# Patient Record
Sex: Female | Born: 1985 | Race: Asian | Hispanic: No | Marital: Single | Smoking: Never smoker
Health system: Southern US, Community
[De-identification: ages and names within clinical notes are randomized; demographics above are authoritative.]

## PROBLEM LIST (undated history)

## (undated) DIAGNOSIS — E039 Hypothyroidism, unspecified: Secondary | ICD-10-CM

## (undated) HISTORY — PX: THYMECTOMY: SHX1063

## (undated) HISTORY — PX: THYROIDECTOMY: SHX17

## (undated) HISTORY — PX: CARDIAC TUMOR EXCISION: SHX1297

---
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
## 2006-02-04 ENCOUNTER — Ambulatory Visit: Payer: Self-pay | Admitting: Endocrinology

## 2006-02-25 ENCOUNTER — Ambulatory Visit: Payer: Self-pay | Admitting: Endocrinology

## 2006-04-04 ENCOUNTER — Ambulatory Visit: Payer: Self-pay | Admitting: Endocrinology

## 2006-06-09 ENCOUNTER — Ambulatory Visit: Payer: Self-pay | Admitting: Endocrinology

## 2006-08-09 ENCOUNTER — Ambulatory Visit: Payer: Self-pay | Admitting: Endocrinology

## 2007-01-10 ENCOUNTER — Ambulatory Visit: Payer: Self-pay | Admitting: Cardiology

## 2007-01-11 ENCOUNTER — Ambulatory Visit: Payer: Self-pay | Admitting: Critical Care Medicine

## 2007-01-11 ENCOUNTER — Encounter: Payer: Self-pay | Admitting: Pulmonary Disease

## 2007-01-11 ENCOUNTER — Ambulatory Visit: Payer: Self-pay | Admitting: Endocrinology

## 2007-01-11 ENCOUNTER — Inpatient Hospital Stay (HOSPITAL_COMMUNITY): Admission: EM | Admit: 2007-01-11 | Discharge: 2007-01-29 | Payer: Self-pay | Admitting: Emergency Medicine

## 2007-01-13 ENCOUNTER — Ambulatory Visit: Payer: Self-pay | Admitting: Thoracic Surgery

## 2007-01-13 ENCOUNTER — Encounter: Payer: Self-pay | Admitting: Critical Care Medicine

## 2007-01-13 ENCOUNTER — Encounter: Payer: Self-pay | Admitting: Thoracic Surgery

## 2007-01-20 ENCOUNTER — Encounter: Payer: Self-pay | Admitting: Thoracic Surgery

## 2007-02-02 ENCOUNTER — Encounter: Payer: Self-pay | Admitting: Endocrinology

## 2007-02-03 ENCOUNTER — Ambulatory Visit: Payer: Self-pay | Admitting: Internal Medicine

## 2007-02-03 DIAGNOSIS — I10 Essential (primary) hypertension: Secondary | ICD-10-CM | POA: Insufficient documentation

## 2007-02-03 DIAGNOSIS — D649 Anemia, unspecified: Secondary | ICD-10-CM | POA: Insufficient documentation

## 2007-02-03 DIAGNOSIS — E878 Other disorders of electrolyte and fluid balance, not elsewhere classified: Secondary | ICD-10-CM | POA: Insufficient documentation

## 2007-02-06 LAB — CONVERTED CEMR LAB
Basophils Absolute: 0.1 10*3/uL (ref 0.0–0.1)
Basophils Relative: 0.8 % (ref 0.0–1.0)
CO2: 30 meq/L (ref 19–32)
Calcium: 7.9 mg/dL — ABNORMAL LOW (ref 8.4–10.5)
Chloride: 99 meq/L (ref 96–112)
Eosinophils Relative: 3.6 % (ref 0.0–5.0)
Hemoglobin: 11.8 g/dL — ABNORMAL LOW (ref 12.0–15.0)
MCHC: 33.5 g/dL (ref 30.0–36.0)
Neutrophils Relative %: 62.5 % (ref 43.0–77.0)
Potassium: 4.7 meq/L (ref 3.5–5.1)
Sodium: 140 meq/L (ref 135–145)

## 2007-02-20 ENCOUNTER — Ambulatory Visit: Payer: Self-pay | Admitting: Endocrinology

## 2007-02-20 DIAGNOSIS — E890 Postprocedural hypothyroidism: Secondary | ICD-10-CM

## 2007-02-20 DIAGNOSIS — E876 Hypokalemia: Secondary | ICD-10-CM

## 2007-02-21 LAB — CONVERTED CEMR LAB
BUN: 10 mg/dL (ref 6–23)
Calcium, Total (PTH): 8.3 mg/dL — ABNORMAL LOW (ref 8.4–10.5)
Chloride: 102 meq/L (ref 96–112)
GFR calc non Af Amer: 167 mL/min
Glucose, Bld: 94 mg/dL (ref 70–99)
Potassium: 4 meq/L (ref 3.5–5.1)

## 2007-03-21 ENCOUNTER — Ambulatory Visit: Payer: Self-pay | Admitting: Endocrinology

## 2007-03-22 LAB — CONVERTED CEMR LAB
CO2: 29 meq/L (ref 19–32)
Calcium: 9.1 mg/dL (ref 8.4–10.5)
Chloride: 102 meq/L (ref 96–112)
Creatinine, Ser: 0.6 mg/dL (ref 0.4–1.2)
Glucose, Bld: 92 mg/dL (ref 70–99)
Potassium: 4.3 meq/L (ref 3.5–5.1)

## 2007-05-16 ENCOUNTER — Ambulatory Visit: Payer: Self-pay | Admitting: Endocrinology

## 2007-08-14 ENCOUNTER — Ambulatory Visit: Payer: Self-pay | Admitting: Endocrinology

## 2007-08-14 DIAGNOSIS — E209 Hypoparathyroidism, unspecified: Secondary | ICD-10-CM

## 2007-08-14 LAB — CONVERTED CEMR LAB
CO2: 30 meq/L (ref 19–32)
Chloride: 107 meq/L (ref 96–112)
GFR calc non Af Amer: 112 mL/min
Glucose, Bld: 92 mg/dL (ref 70–99)
TSH: 1.11 microintl units/mL (ref 0.35–5.50)

## 2007-08-15 LAB — CONVERTED CEMR LAB: PTH: 26.8 pg/mL (ref 14.0–72.0)

## 2008-11-19 IMAGING — CR DG CHEST 2V
2 series · 2 of 2 positions shown · non-contrast
Comparison: None

CLINICAL DATA: Right upper and lower leg pain. History of hyperthyroidism.

CHEST - 2 VIEW

[w chest pa]
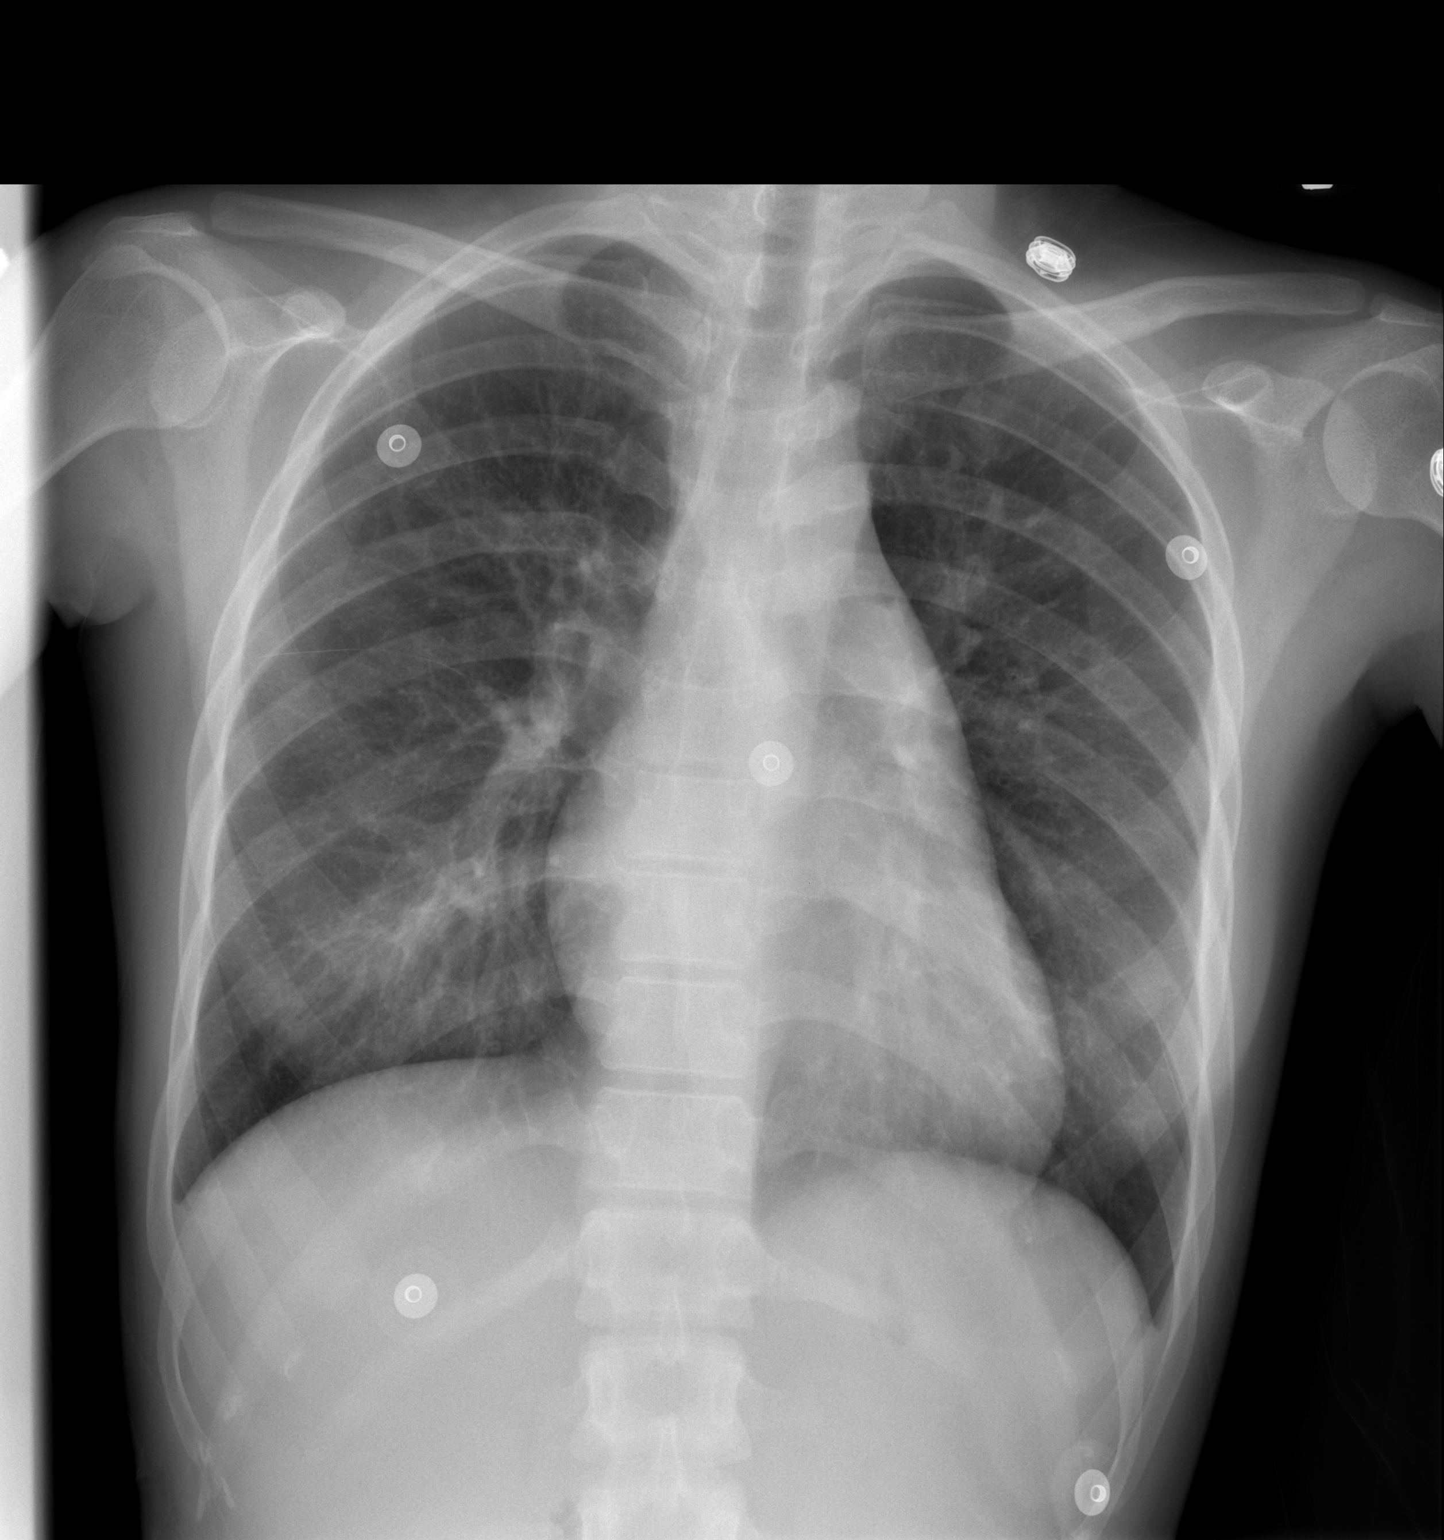

[w chest lat]
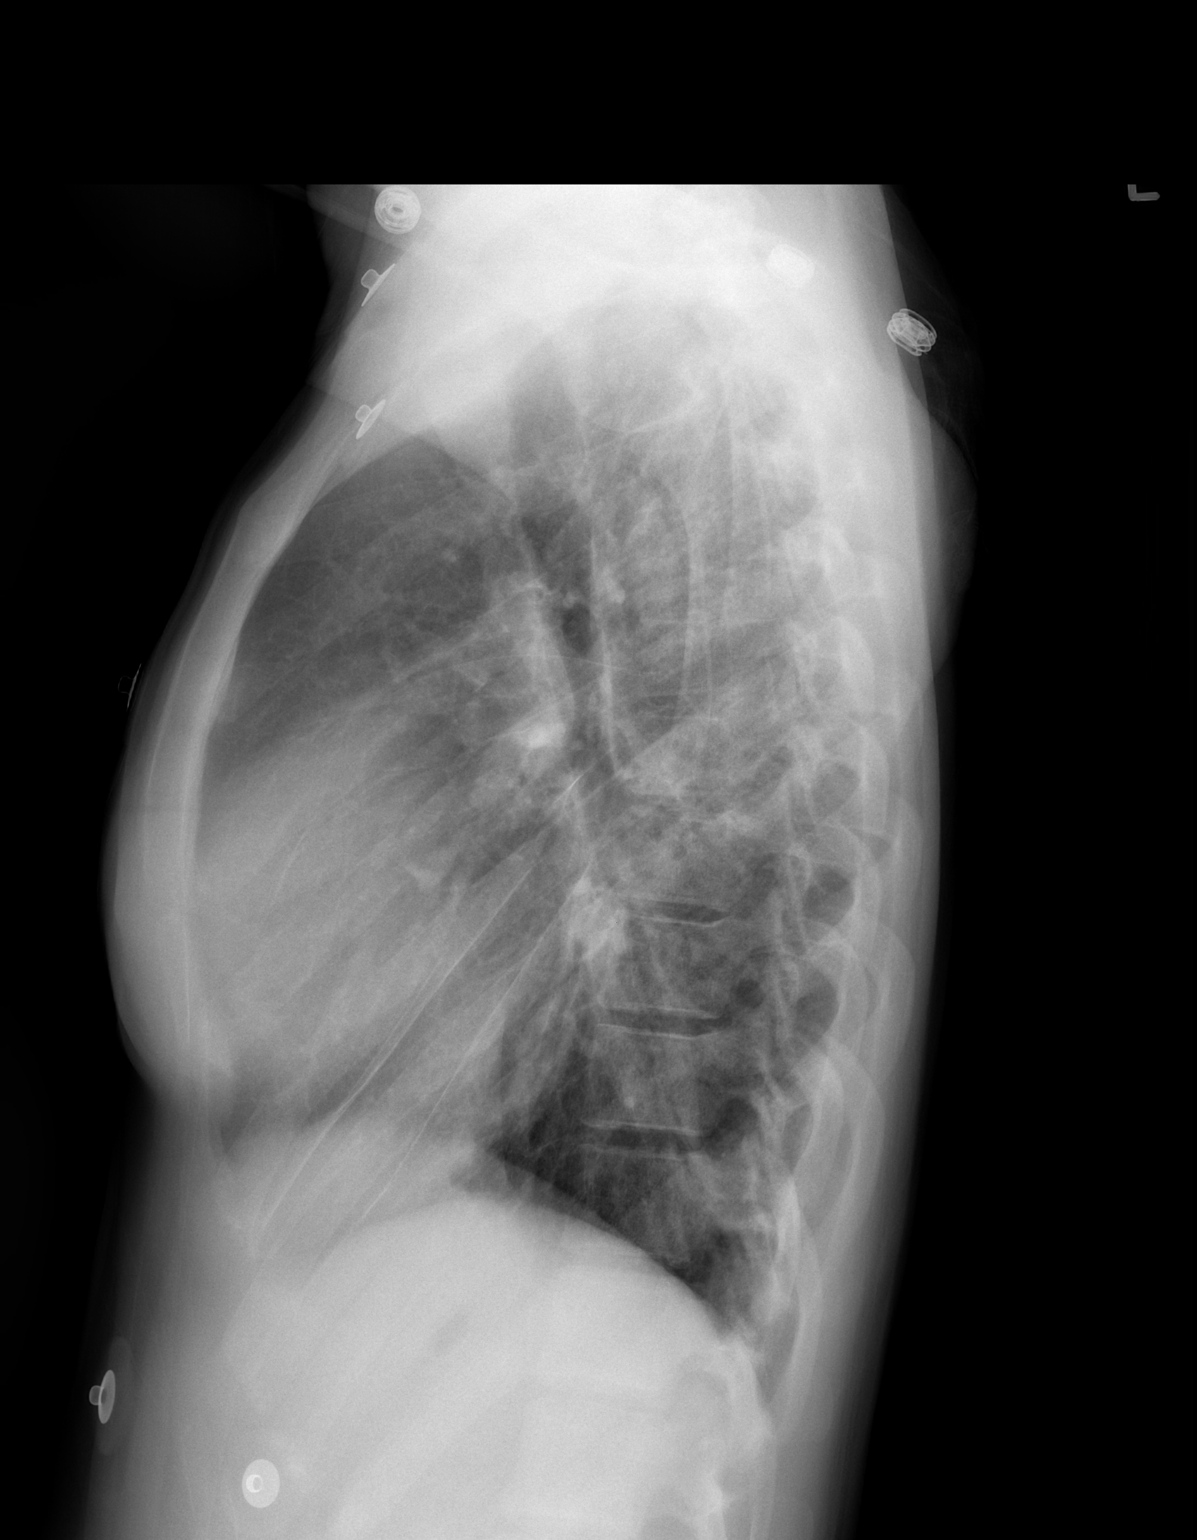

[2 of 2 positions shown; findings below may reference images not displayed]

FINDINGS: Mild pectus excavatum deformity. Midline trachea. Borderline
prominence of pulmonary outflow tract may be secondary to mild pectus deformity.
No pleural effusion or pneumothorax. Moderate peribronchial thickening. Area of
increased density right midlung frontal is felt to be due to summation of ribs
and peribronchial thickening. Left lung clear.

IMPRESSION

1. No acute cardiopulmonary disease.
2. Moderate peribronchial thickening may relate to chronic bronchitis or
smoking.

## 2008-11-20 IMAGING — CR DG CHEST 1V PORT
1 series · 1 of 1 positions shown · non-contrast
Comparison: 01/10/07 at 3833 hours.

CLINICAL DATA: Hyperthyroidism, line placement.  
 PORTABLE CHEST - 1 VIEW 01/10/07 AT 6636 HOURS:

[view not recorded]
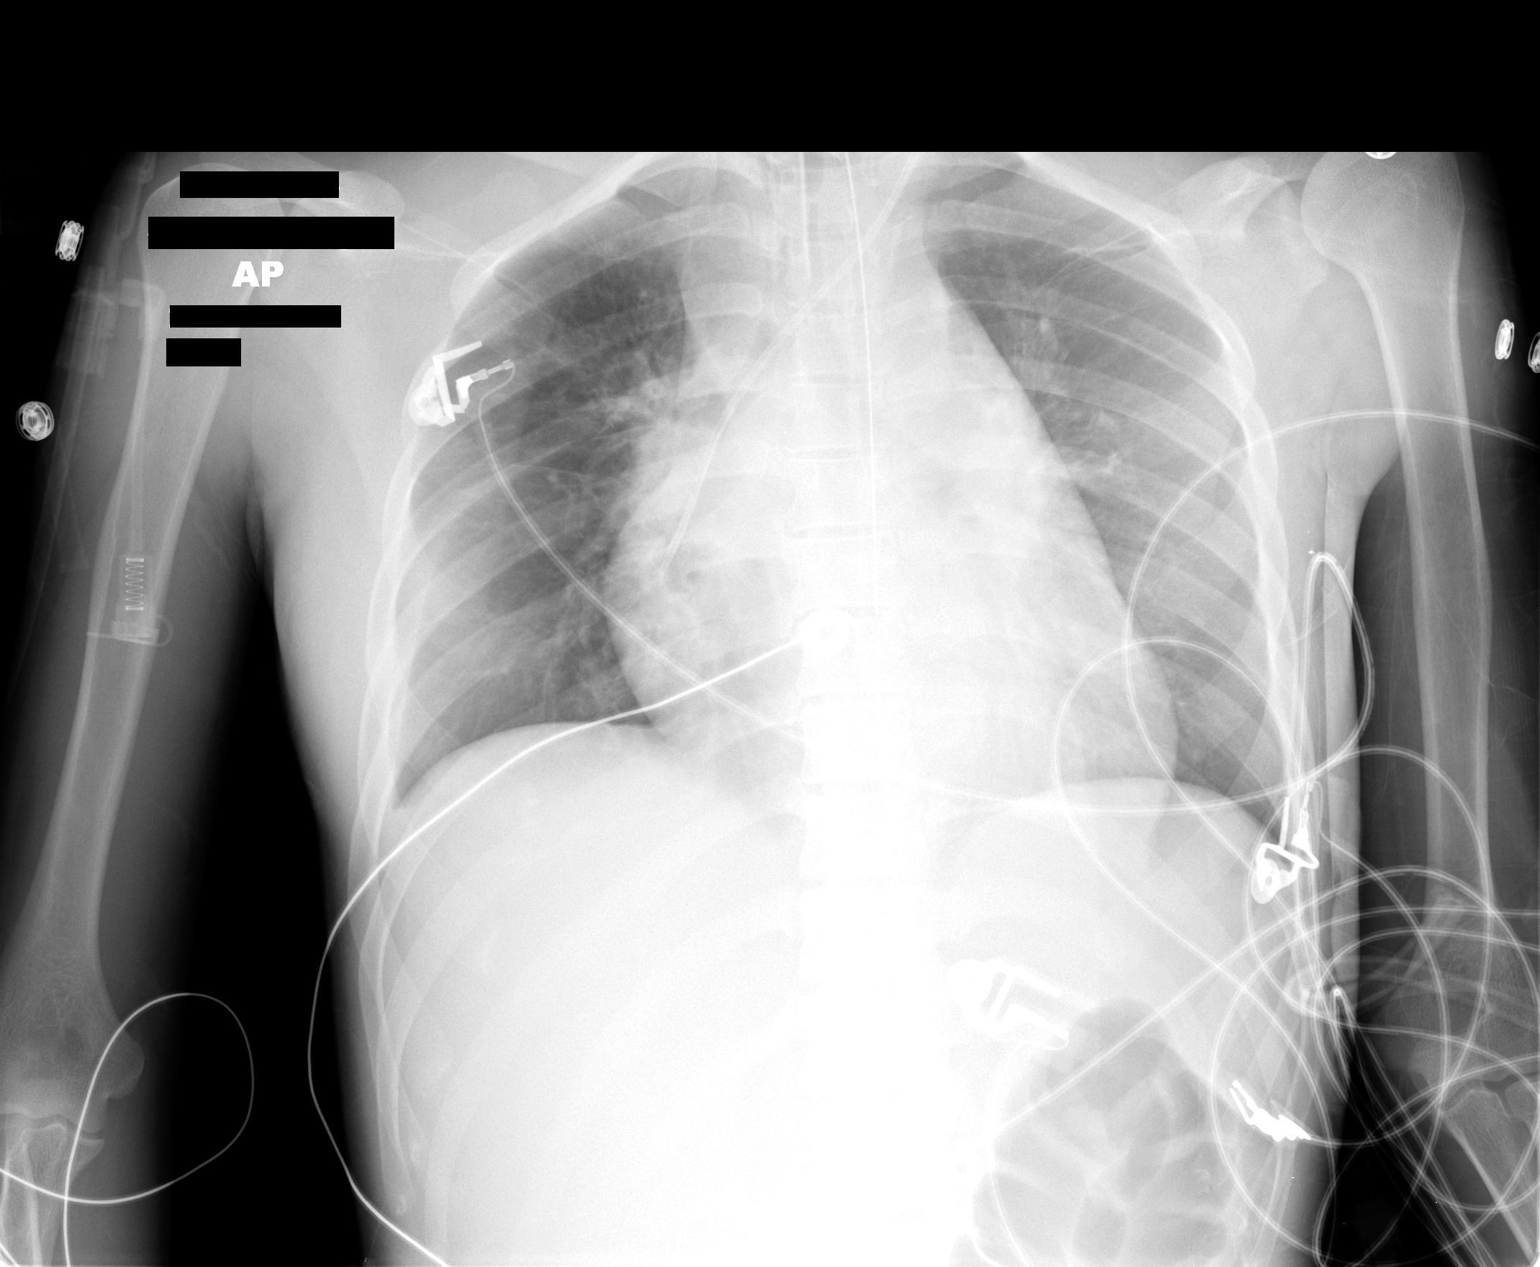

[1 of 1 positions shown; findings below may reference images not displayed]

FINDINGS: A left IJ vein central venous catheter has been placed.  The tip is at the cavoatrial junction.  An NG tube has been placed.  The tip is at the gastroesophageal junction.  It should be advanced.  The endotracheal tube is stable.  Lungs are under inflated and clear.  A small right pleural effusion is noted.  No pneumothorax.
IMPRESSION: 1.  Central venous catheter placement. 
 2.  No pneumothorax. 
 3.  NG tube tip at the gastroesophageal junction. 
 4.  Right effusion.  
 5.  Otherwise stable.

## 2008-11-20 IMAGING — CR DG CHEST 1V PORT
1 series · 1 of 1 positions shown · non-contrast
Comparison: 01/09/2007

CLINICAL DATA: Chest pain.  Endotracheal tube placement.    
PORTABLE CHEST - 01/10/2007 AT 1013 HOURS:

[view not recorded]
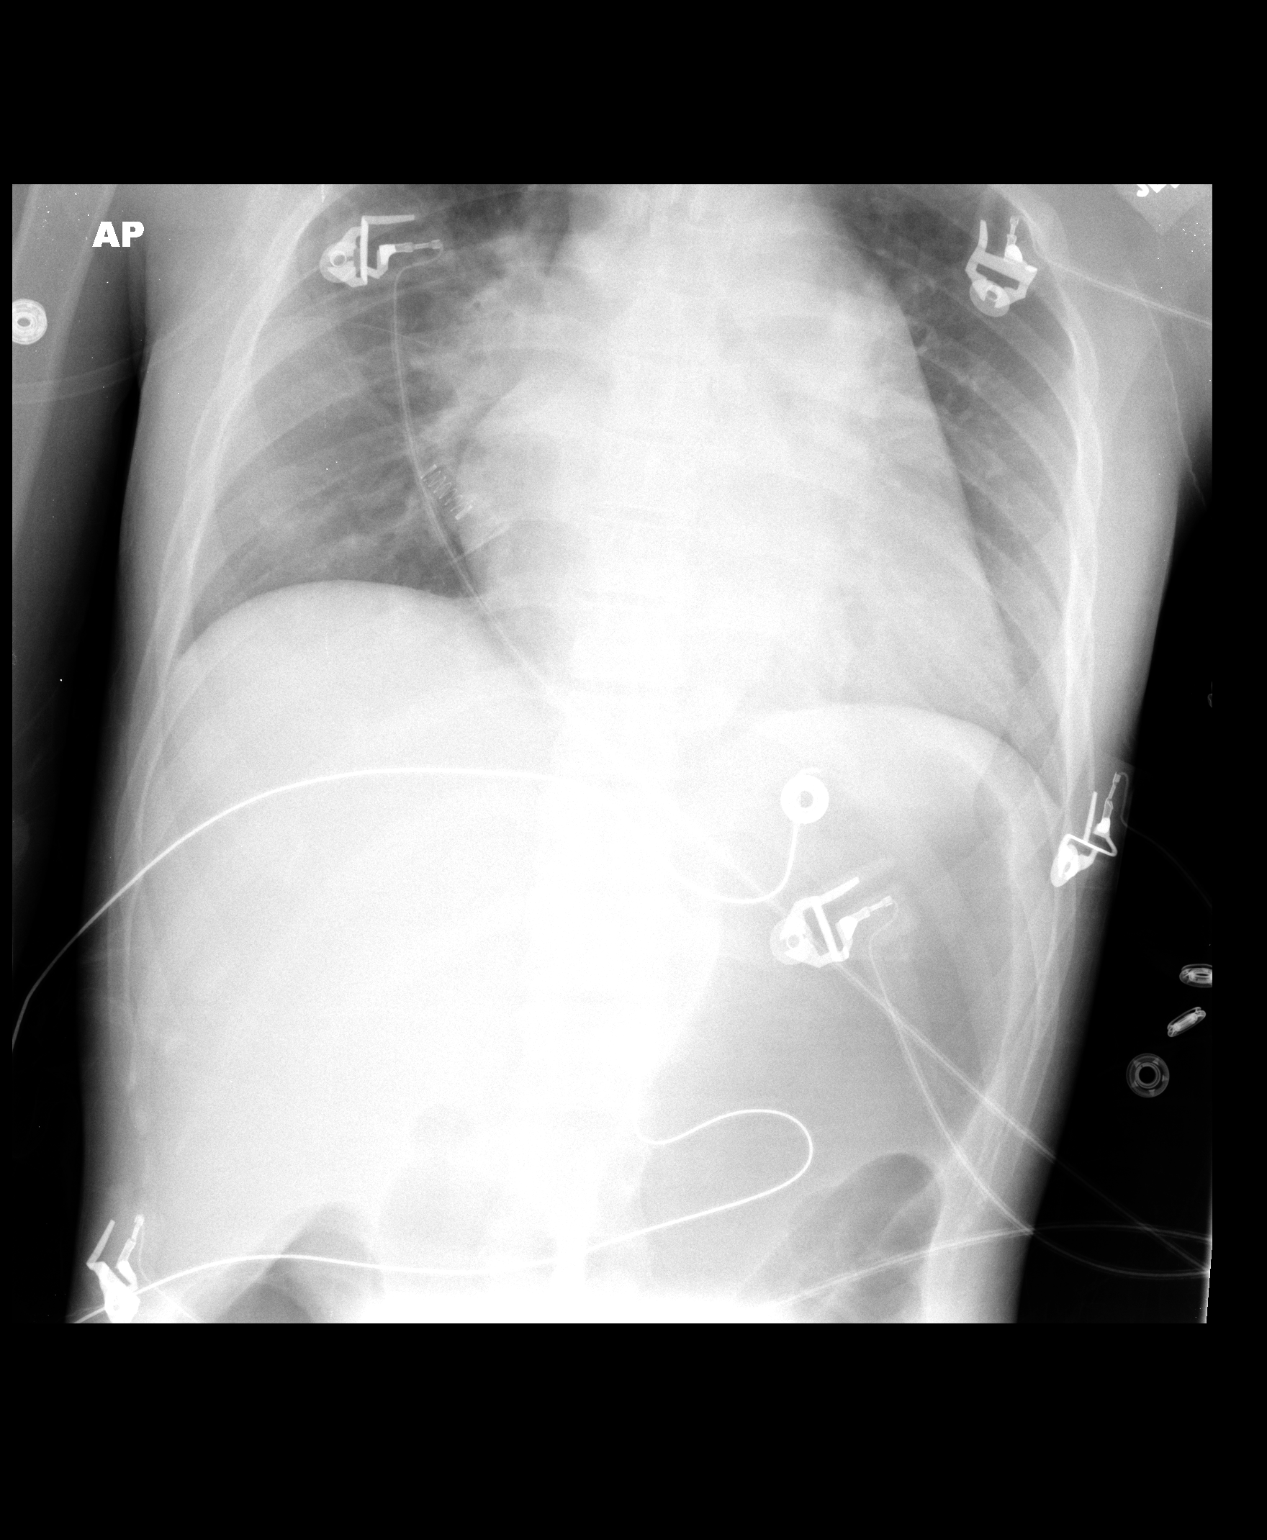

[1 of 1 positions shown; findings below may reference images not displayed]

FINDINGS: Endotracheal tube tip is 1.7 cm above the carina. Despite the portable AP supine nature of the current examination, I believe there is real enlargement of the cardiac silhouette and pulmonary outflow tract compared with the prior examination.  In addition, there are perihilar opacities bilaterally which may reflect atelectasis or edema.  No pleural effusion or pneumothorax is seen.  There is no evidence of acute osseous abnormality.
IMPRESSION: 1.  Endotracheal tube position as above. 
2.  Interval cardiac enlargement with bilateral perihilar edema or possible aspiration.  Correlate clinically.  Consider to CT to exclude pericardial effusion or mediastinal mass.

## 2008-11-21 IMAGING — CT CT HEAD W/O CM
3 of 8 series · 15 of 47 positions shown, 18 images · IV contrast (omnipaque)
Comparison: Plain film of the chest from yesterday and 01/09/2007
COMPARISON: Same as above

CLINICAL DATA: Rule out intracranial hemorrhage. Rule out pulmonary embolism.
Respiratory distress. Hyperthyroidism.

HEAD CT WITHOUT CONTRAST
TECHNIQUE: 5mm collimated images were obtained from the base of the skull
through the vertex according to standard protocol without contrast.
CLINICAL DATA: Same as above
CT ANGIOGRAPHY OF CHEST - PULMONARY EMBOLISM PROTOCOL
TECHNIQUE: Multidetector CT imaging of the chest was performed according to the
protocol for detection of pulmonary embolism during bolus injection of
intravenous contrast.  Coronal and sagittal plane CT angiographic image
reconstructions were also generated.
Contrast:  100 cc Omnipaque 300

[Series 14: pulm embolism 1.0 thins · axial · 0.55mm/px · z∈[-496,-314]mm · 9 of 208 slices shown, 12 images]
[im 13/208  brain]
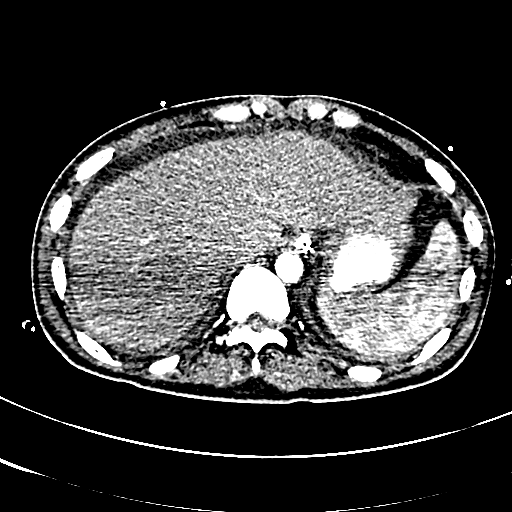
[im 13/208  bone]
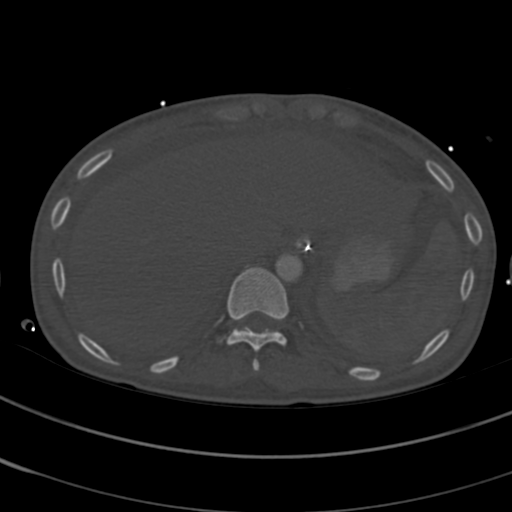
[im 39/208  brain]
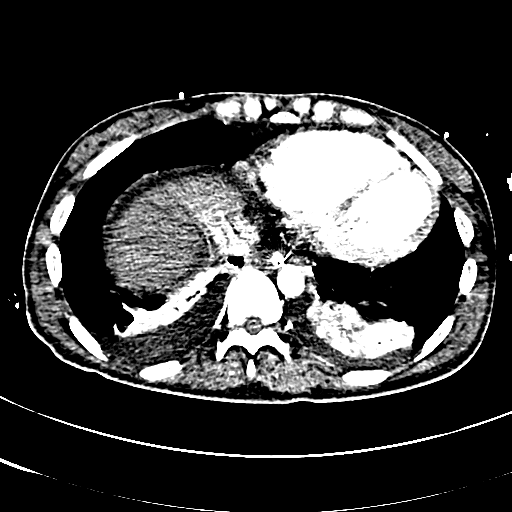
[im 65/208  brain]
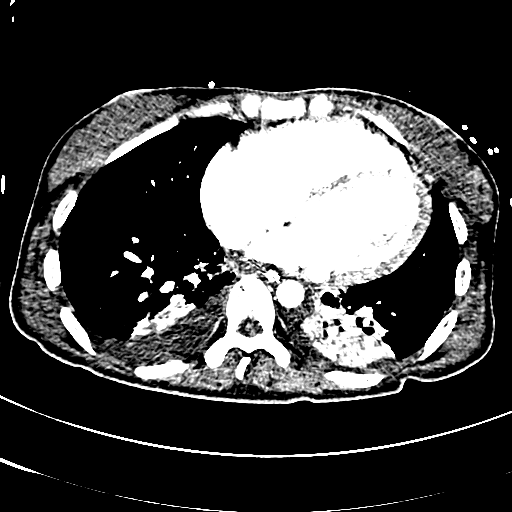
[im 78/208  brain]
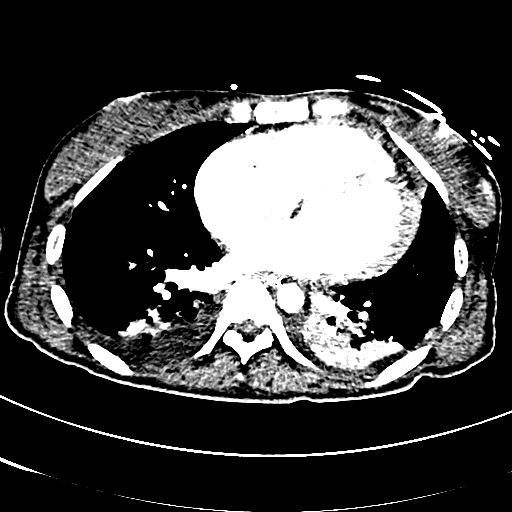
[im 104/208  brain]
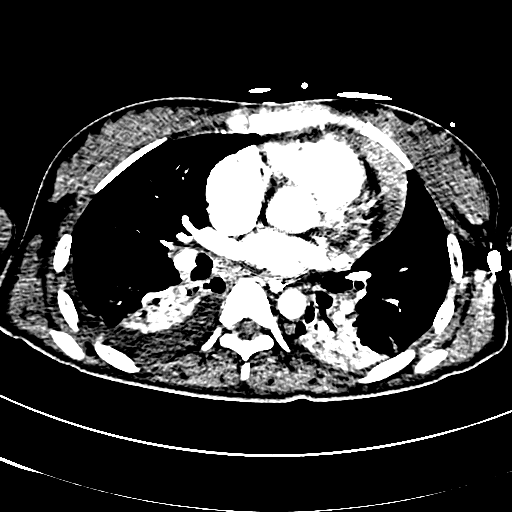
[im 104/208  bone]
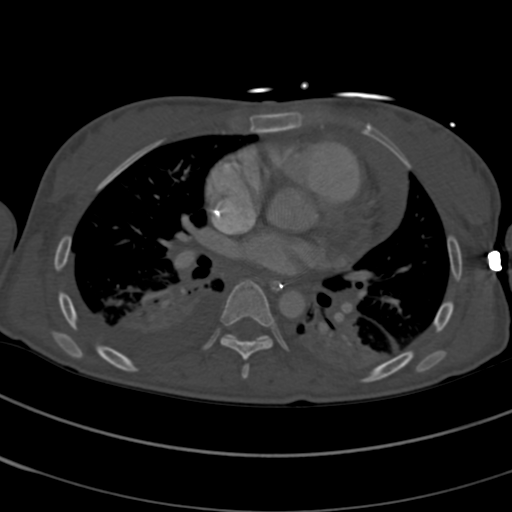
[im 130/208  brain]
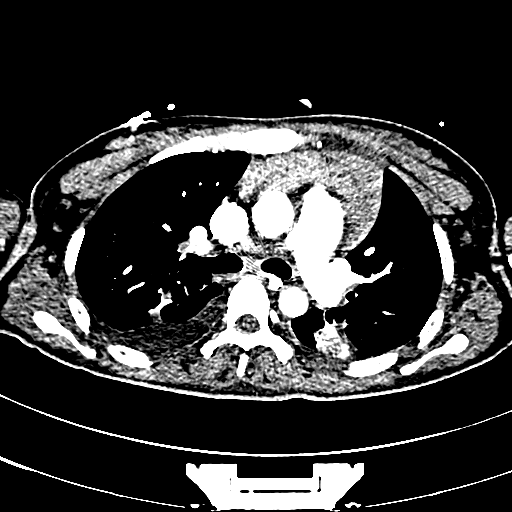
[im 143/208  brain]
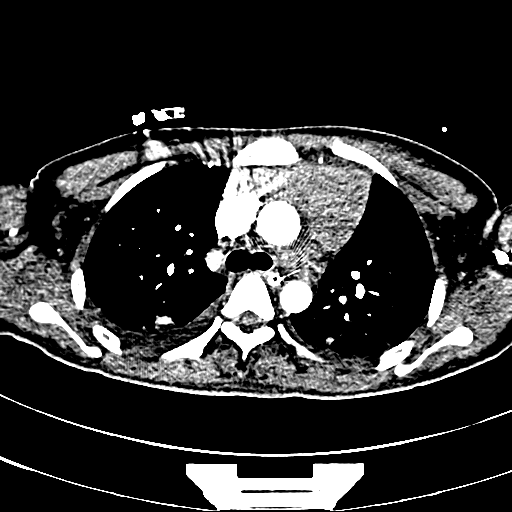
[im 169/208  brain]
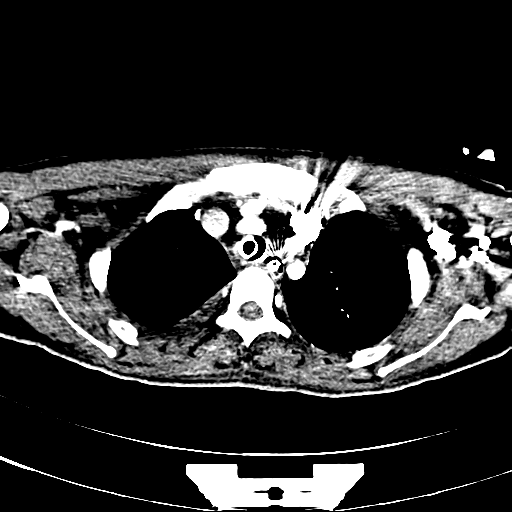
[im 195/208  brain]
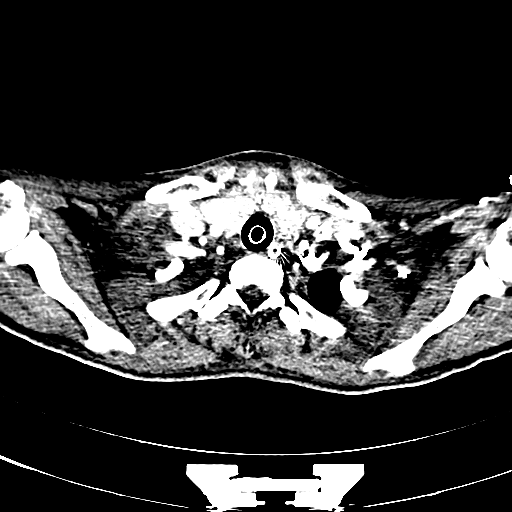
[im 195/208  bone]
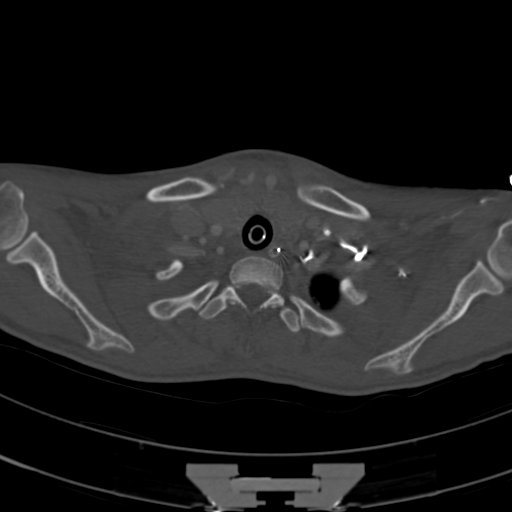

[Series 15: pulm embolism 2.0 cor · coronal · 0.63mm/px · 3 of 79 slices shown]
[im 27/79  brain]
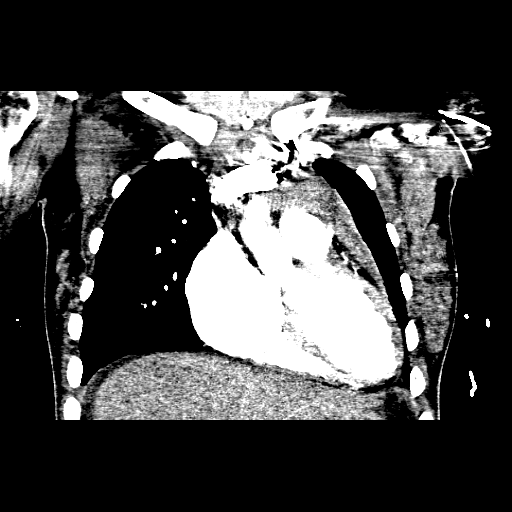
[im 35/79  brain]
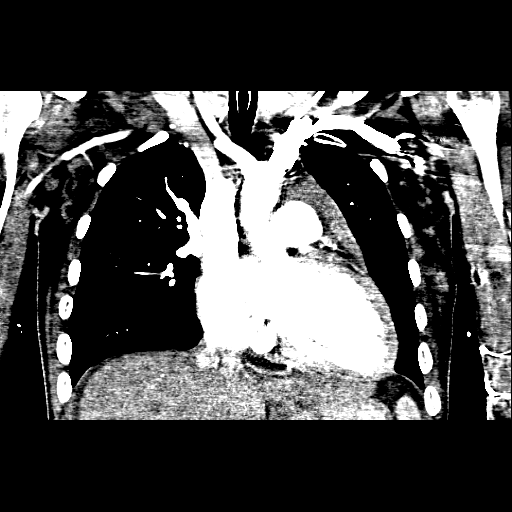
[im 44/79  brain]
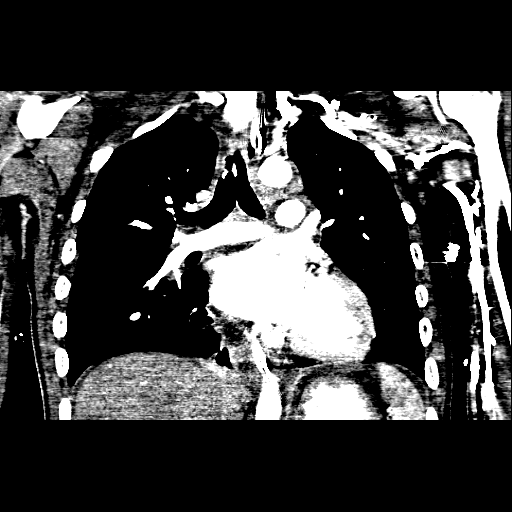

[Series 16: pulm embolism 2.0 sag · sagittal · 0.63mm/px · 3 of 125 slices shown]
[im 42/125  brain]
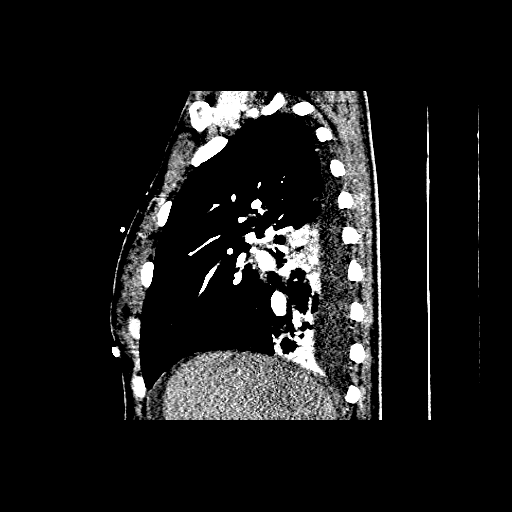
[im 63/125  brain]
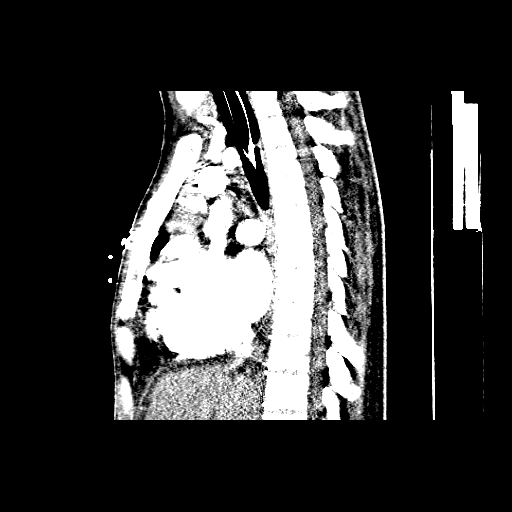
[im 83/125  brain]
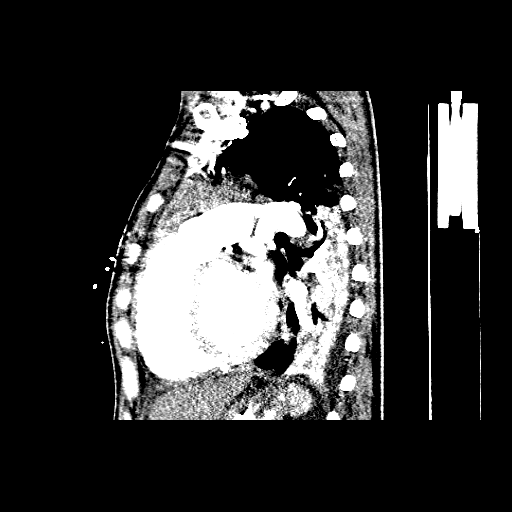

[15 of 47 positions shown; findings below may reference images not displayed]

FINDINGS: Bone windows demonstrate mild mucosal thickening of sphenoid sinuses.
Hypoplastic left frontal sinus. Clear mastoid air cells.

Intracranial imaging demonstrates no mass, hemorrhage, hydrocephalus, acute
infarct, intraaxial, or extra-axial fluid collection. 

IMPRESSION

1. No acute intracranial abnormality.
2. Mild sinus disease.
Please note this is part of a multistudy report.  Please see remainder of
report.
FINDINGS: Lung windows demonstrate endotracheal tube positioned just above the
carina. Bibasilar left greater than right air space opacities are suspicious for
infection or aspiration. Ill-defined nodular opacities suspicious for infection
including atypical etiologies.

Soft tissue windows:  The quality of this exam for evaluation of pulmonary
embolism is good. Mild motion artifact involving the lung bases. No evidence of
pulmonary embolism. .

Diffuse thyroid enlargement likely relates to thyroiditis. Normal aortic
caliber. Mild cardiomegaly. 

Anterior mediastinal soft tissue mass measures maximally 7.5 x 5.8 x 7.5 cm.
Image 39 series 12.

Small right greater than left pleural effusions. No middle mediastinal
adenopathy.

Limited abdominal imaging demonstrates a small amount of ascites. Normal bones.

IMPRESSION

1. No evidence of pulmonary embolism.
2. Anterior mediastinal mass, suspicious for lymphoma. Differential
considerations would include marked thymic hyperplasia or thymic carcinoma. When
patient is stable, a CT PETs should be considered to direct biopsy.
3. Lung findings likely related to a combination of infection and possibly
aspiration. Atypical etiologies should be considered.
4. Small right greater than left pleural effusions and small ascites. 
 5. Thyroidal enlargement likely relates to thyroiditis.
6. 6. Endotracheal tube just above the carina. Consider retraction 1-2 cm.

 I called and personally discussed this report with Chai , at [DATE] a.m. on
01/11/2007 .Please note this is part of a multistudy report.  Please see
remainder of report.

## 2010-04-19 ENCOUNTER — Encounter: Payer: Self-pay | Admitting: Thoracic Surgery

## 2010-08-11 NOTE — Op Note (Signed)
NAMEFLOYD, LUSIGNAN                ACCOUNT NO.:  [id number]   MEDICAL RECORD NO.:  [id number]          PATIENT TYPE:  INP   LOCATION:  2109                         FACILITY:  [facility]   PHYSICIAN:  Ines Bloomer, M.D. DATE OF BIRTH:  05-28-1985   DATE OF PROCEDURE:  01/13/2007  DATE OF DISCHARGE:                               OPERATIVE REPORT   PREOPERATIVE DIAGNOSES:  1. Respiratory failure.  2. Thymic mass.  3. Thyroid mass.   POSTOPERATIVE DIAGNOSES:  1. Respiratory failure.  2. Thymic mass.  3. Thyroid mass.  4. Possible thymoma.   OPERATION PERFORMED:  Video bronchoscopy and mediastinotomy.   After general anesthesia, with the patient on the respirator, we then  exchanged tubes by pulling out a #6 six tube and placing a 7.5 tube.  Then through the 7.5 tube we put a 2.0 scope, and the carina was in the  midline.  The right upper lobe, right middle lobe and right lower lobe  orifices were normal -- except for a lot of pus.  Cultures were taken of  this area.  The left mainstem, left upper lobe and left lower lobe  orifices were normal.  The video bronchoscope was removed.  We then  prepped the left  anterior chest; made a 3-cm incision over the third  intercostal space and dissected down to  enlarged but soft thymus gland.  It was markedly abnormal, but very soft.   We resected the mammary artery laterally and taking care not to get in  the pleura.  We resected 2 pieces, and  marked with clips.  The smallest  piece was sent for frozen.  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   The wound was closed 2-0 Vicryl and 3-0 Vicryl subcuticular stitch, and  Dermabond for skin.  The patient returned to the recovery room in stable  condition.      Ines Bloomer, M.D.  Electronically Signed     DPB/MEDQ  D:  01/13/2007  T:  01/16/2007  Job:  [id number]

## 2010-08-11 NOTE — Op Note (Signed)
NAMEELKE, [name]                ACCOUNT NO.:  [id number]   MEDICAL RECORD NO.:  [id number]          PATIENT TYPE:  INP   LOCATION:  2305                         FACILITY:  [facility]   PHYSICIAN:  Ines Bloomer, M.D. DATE OF BIRTH:  09-Jul-1985   DATE OF PROCEDURE:  DATE OF DISCHARGE:                               OPERATIVE REPORT   PREOPERATIVE DIAGNOSES:  1. Hyperthyroidism with Graves disease.  2. Thymic hyperplasia.   POSTOPERATIVE DIAGNOSES:  1. Hyperthyroidism with Graves disease.  2. Thymic hyperplasia.   OPERATION PERFORMED:  Total thyroidectomy, median sternotomy with  thymectomy.   SURGEON:  Ines Bloomer, M.D.   FIRST ASSISTANT:  Coral Ceo, P.A.   ANESTHESIA:  General anesthesia.   This patient first underwent a total thyroidectomy by Dr. Luisa Hart and  Dr. Janee Morn for a multinodular goiter with thyroid toxicosis.  She was  then admitted to the hospital and had a cardiac arrest.  At the time,  she was found to have a markedly enlarged thymic gland which a biopsy  was done and it showed possible thymic hyperplasia and it was decided to  do resection of both the thyroid and the thymus gland at the same time.  Dr. Luisa Hart and Dr. Janee Morn did the resection of the thymus gland  through a standard thyroid semicircular incision.  After this had been  done, the incision was left open and we T'd down from the middle of the  incisions down the sternum and divided the subcutaneous tissue with  electrocautery and the strap muscles with electrocautery.  The sternum  was divided with saw and a Tuffier inserted.  We then started dissection  above the innominate vein and there was marked amount of edema here and  inflammation.  We carefully dissected out the upper left and right horn  and dissected out the innominate vein, ligating all branches with 2-0  silk and then started dissection down the midline, dissecting up the  isthmus and then turned our attention to the left  thyroid.  There was a  marked amount of adhesions of the pleural through the thymus as well as  marked inflammation of the tissues.  We did a careful resection,  identifying the phrenic nerve and resecting the large thymic gland out  off the pericardium and underneath the innominate vein, dissecting down  the left side and dissecting up the whole lobe of the left side.  The  pleura was partially entered on the left side and then we partially  entered the right side also, again identified the phrenic nerve and  dissected the right horn which was much smaller only about 1/4 of the  left side of the thymus gland.  We first dissected the superior right  horn and then the inferior right horn, resecting that off of the pleura  and surrounding tissue.  It was edematous but not as inflamed as the  left side.  When the entire gland was removed, the areas were irrigated  copiously.  Two chest tubes were brought in through separate stab wounds  and the chest was closed with #6  wires in a twisted fashion, #1 Vicryl  in the muscle layer and 3-0 Vicryl in the subcutaneous tissue and 4-0  Monocryl in the  skin.  We closed the strap muscles of the neck in like manner with  interrupted #1 Vicryl and then running 3-0 Vicryl in the subcutaneous  tissue and Monocryl for the skin.  Dermabond was applied to the skin.  The patient was returned on the ventilator in serious condition.      Ines Bloomer, M.D.  Electronically Signed     DPB/MEDQ  D:  01/20/2007  T:  01/20/2007  Job:  [id number]   cc:   Ines Bloomer, M.D.

## 2010-08-11 NOTE — Consult Note (Signed)
NAMECRISS, BARTLES NO.:  [id number]   MEDICAL RECORD NO.:  [id number]          PATIENT TYPE:  INP   LOCATION:  2109                         FACILITY:  [facility]   PHYSICIAN:  Bevelyn Buckles. Cham[place]y, M.D.DATE OF BIRTH:  1985-11-06   DATE OF CONSULTATION:  01/10/2007  DATE OF DISCHARGE:                                 CONSULTATION   REQUESTING PHYSICIANS:  Dr. Larene Beach and Dr. Deirdre Peer.   REASON FOR CONSULTATION:  Seizure and decreased mental status.   HISTORY OF PRESENT ILLNESS:  Ms. Harold is a 25 year old [place]  female with hyperthyroidism who has been noncompliant with her PTU, who  recently was admitted for uncontrolled hyperthyroidism.  No complaints  of nausea, generalized weakness, fatigue, and palpitations.  The patient  has been off her medications for about a month.  She does complain of  some right-sided body pain.  She was admitted for further care and  treatment.  Today, the family states that the patient had trembling  episodes earlier this morning, stating she felt cold.  This evening she  was complaining of being extremely hot, was diaphoretic, had shortness  of breath, and was restless in bed moving all around.  The patient went  to the floor and back up into bed.  The family stated that when she went  back up into the bed, she had an episode of staring off, her eyes rolled  back a little bit, and she had trembling in her body.  Then she became  unresponsive.  She became more apneic and pulseless, and she was in PEA  for about 6 minutes before her pulse was restored.  CODE was called and  she was resuscitated.  She also had witnessed aspiration with emesis  when she was admitted.  She was transferred to ICU and we were consulted  for further care.  Treatment and chart was reviewed.  The patient was  recently given Ativan prior to evaluation.   PAST MEDICAL HISTORY:  Positive for hyperthyroidism.  Noncompliant with  medications, PT, and not taking for  the past month.   ALLERGIES:  NONE.   FAMILY HISTORY:  Noncontributory.   SOCIAL HISTORY:  Unknown at this time.   REVIEW OF SYSTEMS:  Unable to assess secondary to mental status.   EXAMINATION:  VITALS:  Temperature is 34.8 Celsius, pulse is 96,  respirations 39, O2 sat is 98%, blood pressure is 74/62.  HEENT:  Normocephalic, atraumatic.  The patient has negative doll's.  No  gag.  Sluggish pupil reactivity.  HEART:  Regular.  LUNGS:  Course bilaterally.  ABDOMEN:  Soft.  EXTREMITIES:  Show good pulses.  NEUROLOGICAL EXAMINATION:  The patient is unresponsive, slightly  sedated, and currently intubated.  She supposedly was thrashing and  agitated earlier, and following some simple commands prior to Ativan  given.  Cranial nerves presents negative doll's, no gag, no corneal's.  Pupils are sluggish to nonreactive bilaterally.  MOTOR EXAMINATION:  The patient has no spontaneous movement with  decreased tone throughout.  SENSORY EXAMINATION:  The patient has no arthropathy in all 4  extremities.  Reflexes are 0 to 3 throughout, toes are equal  bilaterally.  Cerebellum unable to assess at this time.   LABORATORY DATA:  WBC is 10.8, hemoglobin 10.9, hematocrit is 33.1,  platelets 175,000.  PT is 17.5, INR is 1.4, PTT is 39.  Beta HCG is  negative.  Sodium was 144, potassium 7.4, chloride is 113, CO2 is 14,  BUN 11, creatinine is 0.75, glucose was 120.  Total bilirubin 1.6,  alkaline phosphatase 123, AST is 205, ALT is 106, albumin 2.1, lactic  acid is 14, calcium is 9.1.  CK is 192, CK MB is 5.3, troponin is 0.29.  Magnesium 1.7, phosphorus 10.1.   IMPRESSION:  A 25 year old [place] female with decreased mental  status, questionable seizure activity, and PEA for 6 minutes,  resuscitated.  The patient is now unresponsive, recently given Ativan.  Would recommend checking stat CT scan, concerned for anoxic brain injury  with her PEA for approximately 6 minutes.  The patient has  multiple  metabolic derangements that need to be corrected as well.  With the  questionable seizure activity and events that occurred earlier, will  load the patient with Keppra 1 gram intravenous as well as start Keppra  500 mg b.i.d. for seizure prophylaxis.  Recommend checking an MRI of the  brain when she is stable.  We will check a routine EG.  Recommend trying  to limit the sedation and we will re-examine the patient in the morning.  Check electrolytes and continue the supportive care and treatment.   Will follow the patient with you.      Bevelyn Buckles. Nash Shearer, M.D.  Electronically Signed     DRC/MEDQ  D:  01/10/2007  T:  01/11/2007  Job:  [id number]

## 2010-08-11 NOTE — Consult Note (Signed)
[name], [name]                ACCOUNT NO.:  [id number]   MEDICAL RECORD NO.:  [id number]          PATIENT TYPE:  INP   LOCATION:  2109                         FACILITY:  [facility]   PHYSICIAN:  Sean A. Everardo All, MD    DATE OF BIRTH:  1986-02-14   DATE OF CONSULTATION:  01/12/2007  DATE OF DISCHARGE:                                 CONSULTATION   REASON FOR CONSULTATION:  Thyroid storm.   HISTORY OF PRESENT ILLNESS:  [age]-year-old woman whom I first saw in  the office about 11 months ago.  She was treated with PTU.  Iodine-131  therapy could not be done because she was too hyperthyroid at the time.  When her hyperthyroidism had improved, she had lost her health insurance  and did not wish to pursue iodine-131 therapy because of its expense.  I  last saw her in the office about 6 months ago.  I am advised that she  stopped taking her medication several months ago.   She was admitted 2 days ago with thyroid storm and, despite the efforts  of physicians on duty, she suffered cardiac arrest.  She was  successfully resuscitated and has improved neurologically.   I am advised that extubation was attempted today, but she required  prompt reintubation apparently due to question of airway obstruction.   PAST MEDICAL HISTORY:  She has also been noted to have an anterior  mediastinal mass for which biopsy is planned tomorrow.  I am unaware of  her having any other medical conditions.   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   REVIEW OF SYSTEMS:  Unobtainable.   PHYSICAL EXAMINATION:  Blood pressure is 123/45, heart rate 93,  respiratory rate 20, temperature is 99.3 (T-max is 102.2).  GENERAL:  Sedated young woman on a ventilator who does not answer  questions and does not respond to commands.  SKIN:  No rash.  Not  diaphoretic.  HEENT:  No proptosis, no periorbital swelling.  NECK:  Unable to properly examined thyroid in her current  position.  CHEST:  Clear to auscultation anteriorly.  CARDIOVASCULAR:  No edema.  Her heart rate is normal on auscultation.  She does not have a murmur.  Pedal pulses are intact.  ABDOMEN:  Soft,  nontender, no hepatosplenomegaly.  No mass.  EXTREMITIES:  No deformity.  NEUROLOGIC:  Sedated.  Neurologic status is extensively documented  elsewhere in the record.   LABORATORY DATA:  TSH is undetectable on January 11, 2007.  Free T4  vastly elevated at 7.29 on January 10, 2007.  T3 level is vastly  elevated at 18.9 pcg/mL on January 10, 2007.  Report from chest CT scan  is also reviewed.   IMPRESSION:  1. Severe hyperthyroidism with thyroid storm.  2. History of noncompliance with medical advice and therapy.  3. Anterior mediastinal mass, the radiologic differential diagnosis of      which is thymoma verses lymphoma.  4. Other medical problems as noted in the record including apparent      aspiration pneumonia suffered  during her cardiac arrest.   PLAN:  1. Increase PTU to 200 mg per gastric tube 3 times a day.  2. I agree with continuing her Inderal to keep her heart rate normal.      I agree with this beta blocker because he has CNS penetration which      she needs  3. Biopsy of the anterior mediastinal mass as scheduled tomorrow.  4. I agree with the plan to keep her sedated.  This is very important      in this clinical situation.  5. I called the doctor on-call this evening, Dr. Billy Fischer, and      discussed the possibility of giving her steroids.  We discussed the      pluses and minuses.  The pluses are that steroids would most likely      improve how quickly her hyperthyroidism improves.  Also, whichever      of the 2 diagnoses is ultimately assigned to her anterior      mediastinal mass, steroids would help shrink the mass in either      case.  This may help her airway management because it is felt by      the critical care team that the mass is impinging on her  airway.      We also discussed the minuses which are possible impaired wound      healing after her biopsy, impaired recovery from her pneumonia, and      impaired ability to interpret the biopsy.  After the discussion, we      decided to leave this to the critical care rounding team tomorrow.  6. I will be checking periodically with her progress.  Please call me      sooner if necessary.      Sean A. Everardo All, MD  Electronically Signed     SAE/MEDQ  D:  01/13/2007  T:  01/14/2007  Job:  [id number]

## 2010-08-11 NOTE — Consult Note (Signed)
[name], [name]                ACCOUNT NO.:  [id number]   MEDICAL RECORD NO.:  [id number]          PATIENT TYPE:  INP   LOCATION:  2109                         FACILITY:  [facility]   PHYSICIAN:  Maisie Fus A. Cornett, M.D.DATE OF BIRTH:  February 07, 1986   DATE OF CONSULTATION:  01/17/2007  DATE OF DISCHARGE:                                 CONSULTATION   PHYSICIAN REQUESTING CONSULTATION:  Dr. Dewayne Shorter.   REASON FOR CONSULTATION:  Thyrotoxicosis, thyromegaly with history of  thyroid storm.   HISTORY OF PRESENT ILLNESS:  The patient is a 25 year old female  admitted last week to the medical service.  She has a longstanding  history of hyperthyroidism, treated by Dr. Romero Belling medically.  She  became noncompliant and presented in thyroid storm last week.  She had a  cardiac arrest from this despite being managed medically and has  suffered anoxic brain injury.  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  Dr. 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  I was asked to see her at the  request of Dr. Edwyna Shell at the request of Dr. Everardo All for consideration of  total thyroidectomy in the setting of thyrotoxicosis and poor medical  compliance.  She was offered radiation therapy and was unable to afford  it a year ago.  Currently, she has had anoxic brain injury and is in the  ICU and is extubated but is not conversant.  I talked with her father  today.  History is obtained via this chart.   PAST MEDICAL HISTORY:  1. Anterior mediastinal mass.  2. Hyperthyroidism as above.   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   FAMILY HISTORY:  Noncontributory.   ALLERGIES TO MEDICATIONS:  None.   CURRENT MEDICATIONS:  In the ICU she is on Protonix 40 mg daily, sliding  scale insulin as needed, tube feeds, PTU 200 mg t.i.d., Keppra 500 mg  b.i.d., Solu-Medrol 80 mg daily, Ambien for sleep, propranolol 40 mg q.6  h,   Lasix one-time, Zosyn 4.5 grams q. 6 h.   PAST SURGICAL HISTORY:  As above.   REVIEW OF SYSTEMS:  Unobtainable.   PHYSICAL EXAMINATION:  VITAL SIGNS:  Blood pressure is 120/40, heart  rate is in the 80s,  temperature is 97, respiratory rate 20.  GENERAL APPEARANCE:  Female, resting comfortably in bed, in  no apparent  distress.  ]  HEENT:  Mucous membranes are somewhat dry.  There is a feeding tube in  her left naris.  No evidence of scleral icterus.  No evidence of  exophthalmos.  NECK:  Thyroid is diffusely enlarged without mass.  Trachea is midline.  No stridor.  CHEST:  Lung sounds clear.  Chest wall motion normal.  CARDIOVASCULAR:  Regular rate and rhythm without murmur or gallop.  EXTREMITIES:  Well-perfused.  ABDOMEN:  Soft, nontender.  No mass.  No hernia.  EXTREMITIES:  Muscle tone normal.  Range of motion is decreased due to  her mental status.  Glasgow coma scale is about a 10 to 11 and it looks  like eyes are open.  She does respond to verbal stimuli but is nonverbal  to me.   DIAGNOSTIC STUDIES:  Reviewed her CT of her neck, which showed thyroid  enlargement.  Her TSH is not detectable at 0.009.  Her T3 and T4 are  markedly elevated.   IMPRESSION:  1. Thyrotoxicosis with history of hyperthyroidism, followed by Dr.      Everardo All with failure of medical management due to noncompliance      with subsequent thyroid storm, cardiac arrest and probable anoxic      brain injury.  2. Thymic mass, followed with Dr. Edwyna Shell.  3. Aspiration ammonia.  4. Anoxic brain injury.  5. History of cardiac arrest.   PLAN:  Given her history, she probably would benefit from total  thyroidectomy since she has been noncompliant and was unable to take  radioactive iodine.  This could be coordinated with Dr. Edwyna Shell and we  will certainly try to do that depending on her medical issues.   Thank you for this consultation.  We will be available to try to  coordinate this as  possible.      Thomas A. Cornett, M.D.  Electronically Signed     TAC/MEDQ  D:  01/17/2007  T:  01/17/2007  Job:  [id number]   cc:   Gregary Signs A. Everardo All, MD  Dr. Dewayne Shorter

## 2010-08-11 NOTE — Op Note (Signed)
[name], [name]                ACCOUNT NO.:  [id number]   MEDICAL RECORD NO.:  [id number]          PATIENT TYPE:  INP   LOCATION:  2550                         FACILITY:  [facility]   PHYSICIAN:  Thomas A. Cornett, M.D.DATE OF BIRTH:  Sep 19, 1985   DATE OF PROCEDURE:  01/20/2007  DATE OF DISCHARGE:                               OPERATIVE REPORT   PREOPERATIVE DIAGNOSIS:  Hyperthyroidism with history of thyroid storm  and noncompliance, with goiter.   POSTOPERATIVE DIAGNOSIS:  Hyperthyroidism with history of thyroid storm  and noncompliance, with goiter.   PROCEDURE:  Total thyroidectomy.   SURGEON:  Harriette Bouillon, MD.   ASSISTANT:  Violeta Gelinas, MD.   ESTIMATED BLOOD LOSS:  200 mL.   ANESTHESIA:  General endotracheal anesthesia.   DRAINS:  None.   INDICATIONS FOR PROCEDURE:  The patient is 25 year old female who was  found to have a thymoma at admission after being admitted for thyroid  storm due to noncompliance secondary to hyperthyroidism.  She had a  cardiac arrest and then was in the ICU.  She underwent a biopsy by Dr.  Dewayne Shorter of a large mediastinal mass which appeared to be a thymoma.  It was recommended that she have the thymoma removed and at the same  time undergo thyroidectomy due to her noncompliance with medication  secondary to loss of insurance, expensive medication, expensive  radioactive iodine which she was offered a year ago but unfortunately  could not afford since she lost her insurance.  Given the fact that she  has had a near-death experience from her storm, we felt thyroidectomy  would be the best long-term solution for her.  I discussed this with  her, and she understood and agreed.  This was going to be done  concomitantly with Dr. Dewayne Shorter.   DESCRIPTION OF PROCEDURE:  The patient was brought to the operating room  and placed supine.  The neck and chest were prepped and draped in a  sterile fashion.  A transverse collar incision was used.   Dissection was  carried down through the platysma muscle, and superior and inferior  flaps were raised.  We then divided the strap muscles in the middle and  were able to expose a very large gland.  Both lobes were enlarged 2-1/2  to 3 times normal.  It was quite hypervascular, and there was a  significant amount of inflammation involving the gland and the adjacent  muscles.  This was a very tedious part of the procedure.  We were able  to tease the muscle away from both thyroid lobes.  This took quite some  time.  We isolated the superior lobe on the left first and got the  vascular pedicle down with combination of clips and ligature ties.  Once  the superior lobe came down, we took the middle thyroid vein.  This help  roll the gland up and then worked on the inferior pole.  Once we took  these vessels individually between clips and small ties, taking great  care to stay on the gland.  Unfortunately, the tubercle of the thyroid  was stuck down on the recurrent nerve.  Given this was not a cancer  operation, I felt that leaving a small amount of thyroid tissue behind  in this setting was the safest thing to do given the hypervascularity of  the gland, the large size and the fact that I could not separate the  thyroid from the nerve and I was worried about injury.  I then took the  inferior thyroid vessels between small clips and ties individually with  great care.  We were able to visualize the superior parathyroid gland  and inferior parathyroid gland on the left and left these intact without  evidence of devascularization.  At this point we went ahead and divided  the left thyroid lobe, leaving a small amount of tissue behind to  protect the nerve.  We then took it off the trachea.   Next the right lobe was approached in similar fashion.  Both the  superior and inferior poles were taken down between clips and ligatures.  Middle thyroid vein was taken down in a similar fashion.  The  gland was  rolled up, and again we carefully dissected the gland down to where the  tubercle was.  On this side the tubercle was separate from the recurrent  nerve.  I was able to dissect these to apart.  I still left a small  amount of thyroid tissue though since it was adjacent.  The right  inferior gland showed some signs of bruising, but I was able to replace  this back.  I did not see the right superior parathyroid gland.  After  mobilizing the gland completely up, we then used the cautery to dissect  it off the trachea and pass it off the field.  Of note, I divided the  gland in half after mobilizing the left lobe for more space.  Hemostasis  was achieved.  Irrigation was used and suctioned out.  Again, both  nerves were protected, and a small amount of thyroid tissue was left  behind due to the inflammatory nature of this.  Surgicel was placed in  the wound.  At this point Dr. Edwyna Shell came in and instructed me to leave  the wound open for his sternotomy part, and we will close this during  his part of the procedure.  All final counts of sponge, needle and  instruments were found to be correct in this portion of the case.      Thomas A. Cornett, M.D.  Electronically Signed     TAC/MEDQ  D:  01/20/2007  T:  01/22/2007  Job:  [id number]   cc:   Gregary Signs A. Everardo All, MD

## 2010-08-11 NOTE — H&P (Signed)
[name], [name]                ACCOUNT NO.:  [id number]   MEDICAL RECORD NO.:  [id number]          PATIENT TYPE:  INP   LOCATION:  6522                         FACILITY:  [facility]   PHYSICIAN:  Hollice Espy, M.D.DATE OF BIRTH:  [birth year]   DATE OF ADMISSION:  01/09/2007  DATE OF DISCHARGE:                              HISTORY & PHYSICAL   PRIMARY CARE PHYSICIAN:  Sean A. Everardo All, MD   CHIEF COMPLAINT:  Weakness and palpitations as well as upper arm and  foot pain.   HISTORY OF PRESENT ILLNESS:  The patient is a 25 year old, [place]  female who has been diagnosed with hyperthyroidism and started on PTU.  She has been noncompliant with medication because she says she does not  like the taste.  She says that she has not been on this medicine now for  a month.  She came in initially complaining of right arm, calf and foot  pain worsening x5 days.  When the ER attending examined her, he actually  could not find any focal findings.  He ordered a D-dimer which was in  the normal range and therefore, he ruled out a clot.  However, it was  noted on admission, she had a blood pressure of 154/87 with a heart rate  in the 140s at sinus tachycardia.  He ordered lab work on the patient  and found her slightly low potassium of 3.2, but the rest of her labs  were essentially unremarkable.  Her albumin level was also noted to be  low at 3.  He suspected that she was severely hyperthyroid secondary to  noncompliance and felt that she needed to come in to warrant further  admission.  When I saw the patient, she was complaining of significant  fatigue, but otherwise was doing well.  She denied any headaches, vision  changes, dysphagia, no chest pain.  She was complaining of palpitations,  no shortness of breath, no wheezing or coughing, no abdominal pain, no  hematuria or dysuria, no constipation or diarrhea.  She was having  earlier focal extremity, right-sided arm and leg pain, but now she  says  that is gone.  Specifically, she notes it in the right arm below the  shoulder up to the forearm as well as in the right calf, but again, that  was previous.  Review of systems is otherwise negative.   PAST MEDICAL HISTORY:  1. Medical noncompliance.  2. Hyperthyroidism.   MEDICATIONS:  She is suppose to be on PTU, but she has not been taking  this.  She is not on any other medicines.   FAMILY HISTORY:  Noncontributory.  She denies any tobacco, alcohol or  drug use.   PHYSICAL EXAMINATION:  VITAL SIGNS:  Temperature 98.1, heart rate 143,  now down to 123, blood pressure 154/87, now down to 125/42, respirations  24, O2 saturations 99% on room air.  GENERAL:  She is alert and oriented x3 in no apparent distress.  HEENT:  Normocephalic, atraumatic.  Mucous membranes are slightly dry.  She has no carotid bruits.  HEART:  Regular rhythm with mild  tachycardia.  LUNGS:  Clear to auscultation bilaterally.  ABDOMEN:  Soft, nontender, nondistended.  Positive bowel sounds.  EXTREMITIES:  No clubbing, cyanosis or edema.   LABORATORY DATA AND X-RAY FINDINGS:  Chest x-ray with mild peribronchial  thickening secondary to chronic bronchitis, no acute cardiopulmonary  disease.   Sodium 141, potassium 3.2, chloride 103, bicarb 27, BUN 11, creatinine  0.6, glucose 142.  LFTs are noted for an albumin of 3.  CPK 27.  D-dimer  0.34.  TSH has been ordered and is pending.   ASSESSMENT/PLAN:  1. Uncontrolled hyperthyroidism.  Because of the patient's      tachycardia, risk for atrial fibrillation and elevated blood      pressure, we will start her on PTU.  She is noncompliant and while      examining her, she told me she does not like the taste of PTU and      told her that she needed to restart this medication.  She likely      needs alternative options presented such as radioablation or      surgical ablation.  2. Hypertension secondary to #1.  Add beta-blocker for now.  3. Noncompliance,  see above.  4. Hypokalemia.  Will replace.  5. Protein calorie malnutrition.  Will add Ensure shakes.      Hollice Espy, M.D.  Electronically Signed     SKK/MEDQ  D:  01/10/2007  T:  01/10/2007  Job:  [id number]   cc:   Gregary Signs A. Everardo All, MD

## 2010-08-11 NOTE — Procedures (Signed)
EEG NUMBER:  [id number].   REFERRING PHYSICIAN:  Coralyn Helling, MD.   CLINICAL HISTORY:  A 26 year old patient with anoxic brain injury.   This is a portable study recorded with the patient being unresponsive.   MEDICATION LISTED:  Protonix, Ativan, potassium, Keppra, Zofran,  Fentanyl, Zosyn, propranolol and Versed.   No clear background rhythm is noted.  Record consists of essentially  deep sleep recording with background of 2-3 Hz delta with superimposed  sleep spindles noted intermittently throughout the recording.  No awake  portions noted.  No definite epileptiform activity is identified.   IMPRESSION:  This EEG is abnormal due to presence of excessive deep  sleep stages.  No definite epileptiform features were noted.           ______________________________  Sunny Schlein. Pearlean Brownie, MD     ZDG:LOVF  D:  01/11/2007 18:01:55  T:  01/12/2007 15:00:57  Job #:  [id number]

## 2010-08-11 NOTE — Discharge Summary (Signed)
NAMEKERSTEN, [name]                ACCOUNT NO.:  [id number]   MEDICAL RECORD NO.:  [id number]          PATIENT TYPE:  INP   LOCATION:  6742                         FACILITY:  [facility]   PHYSICIAN:  Sean A. Everardo All, MD    DATE OF BIRTH:  1985/04/29   DATE OF ADMISSION:  01/09/2007  DATE OF DISCHARGE:  01/29/2007                               DISCHARGE SUMMARY   REASON FOR ADMISSION:  Thyroid storm.   HISTORY OF THE PRESENT ILLNESS:  A 25 year old woman admitted by Dr.  Rito Wright on January 09, 2007 with thyroid storm.  Please refer to Dr.  Chancy Wright dictated history and physical for details.   HOSPITAL COURSE:  The patient was admitted and treated aggressively for  her thyroid storm.  Despite the efforts of the attending physicians, the  patient suffered a cardiac arrest.  Resuscitation was successful, and  she was transferred to the critical care service.  She was also found to  have a thymoma in the anterior mediastinum.  She underwent resection of  this as well as a thyroidectomy on January 20, 2007.  She steadily  improved and was placed on thyroid hormone supplement.  The prednisone  used to treat her thyroid storm has been tapered and is tapered further  at the time of discharge.   There was also a question of seizure activity, and she was treated with  Keppra.  This was discontinued at the time of discharge, because it is  expensive and the patient has no insurance.  Also, the factors leading  to the question of seizure activity would appear to be no longer present  except as noted below.   She had postoperative hypocalcemia, and this was treated with calcium  and Rocaltrol.  At the time of discharge, her Rocaltrol is doubled to  0.5 mcg a day.   She was also hypokalemic and hypertensive for uncertain reasons.  Therapy for these is continued at the time of discharge, although her  [name] is decreased at the time of discharge.   She was also hyperglycemic after she was given  the steroids, but this  resolved with reduction of the steroids.   She also had anemia which was felt to have a postoperative component,  and this will be followed as an outpatient.   As of January 29, 2007, she is alert, oriented, ambulatory, and feeling  much better.  Her voice is still hoarse, but this is usually a transient  phenomenon.  She is discharged home in good condition on January 29, 2007.   DIAGNOSES AT THE TIME OF DISCHARGE:  1. Hyperthyroidism with storm, resolved with a thyroidectomy.  2. Respiratory failure and code blue due to thyroid storm, resolved.  3. Thymoma, status post resection.  4. Mild hypertension, uncertain etiology, question transient.  5. Hypokalemia, uncertain etiology, question transient.  6. Hyperglycemia due to steroids, resolved.  7. Postoperative hypothyroidism.  8. Postoperative hypocalcemia, which is usually transient.  9. Mild anemia, which is probably postoperative and transient.   MEDICATIONS AT THE TIME OF DISCHARGE:  1. Synthroid 75 mcg daily.  2.  Rocaltrol 0.5 mcg daily.  3. Zebeta 2.5 mg daily.  4. Prednisone 2 mg daily.  5. Potassium chloride 40 mEq twice daily.  6. Tums 2 pills twice daily.   No restriction on diet.   She is advised to increase her activity slowly, and should not work for  now, pending her follow-up visit with me in 7 days.  She is also going  to have a follow-up visit with Dr. Luisa Wright about 2 weeks.      Sean A. Everardo All, MD  Electronically Signed     SAE/MEDQ  D:  01/29/2007  T:  01/30/2007  Job:  [id number]

## 2010-08-11 NOTE — Consult Note (Signed)
NAMEKAIYANA, BEDORE                ACCOUNT NO.:  [id number]   MEDICAL RECORD NO.:  [id number]          PATIENT TYPE:  INP   LOCATION:  6742                         FACILITY:  [facility]   PHYSICIAN:  Antonietta Breach, M.D.  DATE OF BIRTH:  08-31-1985   DATE OF CONSULTATION:  01/16/2007  DATE OF DISCHARGE:  01/29/2007                                 CONSULTATION   REASON FOR CONSULTATION:  Psychosis.   Ms. Eisler is a 25 year old female admitted to the [facility]  on October 13 with thyroid storm   The patient suffered an MI and asystole.  She has been having continued  auditory hallucinations with severe anxiety and agitation.  She is in  the intensive care unit setting.   The patient has had increased QTC.   VITAL SIGNS:  Temperature 99.4, pulse 85, respiratory rate 147/68, O2  saturation on room air 100%.   MENTAL STATUS EXAM:  Ms. Bernath is alert.  She is having catastrophic  anxiety.  Affect is anxious.  Her facies are stressed.  Thought process  coherent, thought content, hallucinations.  Judgment is impaired.  Insight is intact that she has mental distress.   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   Would continue to follow the QTC until it consistently below 500  milliseconds.   Would then utilize Zyprexa since it has a reportedly lower risk of  raising the QTC, would start Zyprexa at 5 mg p.o. or IM q. 1800.  Antonietta Breach, M.D.  Electronically Signed     JW/MEDQ  D:  01/29/2007  T:  01/30/2007  Job:  [id number]   cc:   Charlcie Cradle. Delford Field, MD, FCCP

## 2010-08-11 NOTE — Consult Note (Signed)
[name], STEGMAIER NO.:  [id number]   MEDICAL RECORD NO.:  [id number]          PATIENT TYPE:  OBV   LOCATION:  2109                         FACILITY:  [facility]   PHYSICIAN:  Ines Bloomer, M.D. DATE OF BIRTH:  02/05/1986   DATE OF CONSULTATION:  DATE OF DISCHARGE:                                 CONSULTATION   Chief Complaint-Dyspnea   HPI>A 25 year old [place] woman was admitted to hospital with dyspnea  and weakness.She went to the floor and sustained a cardiorespiratory  arrest and was resucitated. She was then brought to the ICU on a  respirator. A CT Scan was obtained which showed thyroid enlargment with  a large thymic mass.We are asked to see herfor possible biopsy of the  thymic mass and evaluation of the thyroid mass.She had been treated for  hyperthyroidism but stopped taking her medications.She had a large  palpable goiter.   ALLERGIES:  No known allergies.   FAMILY HISTORY:  Noncontributory.   SOCIAL HISTORY:  Unobtainable.   REVIEW OF SYSTEMS:  unobtainable as she is on the respirator.   PHYSICAL EXAMINATION:  VITAL SIGNS:  Blood pressure 80/60, saturations  are 98%, pulse is 100.  HEAD, EYES, EARS, NOSE AND THROAT:  She is intubated.  CHEST:  Bilateral breath sounds.  HEART:  Regular sinus rhythm.  NECK:  Supple with thyromegaly, jugular venous distention to the angle  of the jawABDOMEN:  Soft.  EXTREMITIES:  Pulses are 2+, there is no edema.  NEUROLOGIC:  As reported by NEUROLOGIST UNRESPONSIVE   LABORATORY DATA:  SEE CHART  s   IMPRESSION:  1. Hyperthyroidism.  2. 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  3. Status post cardiac arrest with cerebral impairment.   PLAN:  Reevaluate if brain function improves.  Will eventually need a  left mediastinotomy or biopsy of the thymoma or lymphoma.      Ines Bloomer, M.D.  Electronically Signed     DPB/MEDQ  D:  01/11/2007  T:  01/12/2007  Job:  [id number]

## 2010-08-14 NOTE — Op Note (Signed)
. [facility]  Patient:    [name], [name]                         MRN: [id number] Proc. Date: 06/12/99 Attending:  Lucky Cowboy, M.D. CC:         Phill Myron, Nose, and Throat             Fayne Norrie, M.D.                           Operative Report  PREOPERATIVE DIAGNOSIS:  Right chronic otitis externa with possible otitis media, and ventral surface of tongue mass.  POSTOPERATIVE DIAGNOSIS:  Right chronic otitis externa, ventral surface of tongue mass.  PROCEDURE:  _______ ear examination, excision of ventral surface of tongue lesion.  SURGEON:  Lucky Cowboy, M.D.  ANESTHESIA:  General endotracheal anesthesia.  ESTIMATED BLOOD LOSS:  2 cc.  SPECIMENS:  Tongue lesion.  COMPLICATIONS:  None.  INDICATIONS:  The patient is a 25 year old female with a two month history of swelling on the ventral surface of the left side of the tongue.  This has failed to resolve.  There is no known trauma to the area.  There has been no pain.  In addition, on incidental finding, the patient was noted to have significant granulation and purulent drainage of the right external auditory canal. Medical therapy has been instituted and the patient continues to have granulation, although improved on the anterior and inferior quadrant.  For this reason, a _______ examined to ensure no middle ear disease.  FINDINGS:  The patient was noted to have a 3 mm left ventral surface of tongue ass which was somewhat firm and pale white in color.  After this was excised, clear  saliva was expressed.  The right ear revealed granulation tissue of the anterior and inferior quadrant.  This appeared to be an area of an old perforation which was approximately 30%.  The tympanic membrane is intact and this area was mobile, and there did not appear to be any middle ear fluid.  DESCRIPTION OF PROCEDURE:  The patient was taken to the operating room and placed on the  table in the supine position.  She was then placed under general endotracheal anesthesia.  A #4 ear speculum placed in the right external auditory canal.  With the aid of the operating microscope, inspection of the tympanic membrane was performed.  A soft curet was then placed against the drum in the anterior-inferior quadrant to assess full mobility.  The drum is very mobile. or this reason, no further treatment was employed today.  Attention was then turned to the oral cavity excision.  A towel clip was used to grasp the tongue which was reflected anteriorly.  One percent lidocaine with 1:100,000 of epinephrine was then used to inject the ventral surface of the tongue, just beneath the area of incision.  A #15 blade was then used to make an elliptical transverse excision, no biopsy.  Upon removing the mass, frank clear saliva was  expressed.  The remainder of the mucosa was also removed for this reason.  Initially, there did appear to be a small dilated duct, and this was excised. he mucosa was then reapproximated in a simple interrupted fashion using 4-0 Vicryl. The patient was then awakened from anesthesia and extubated in the operating room. She was taken to the postanesthesia care unit in stable condition.  There  were o complications. DD:  06/12/99 TD:  06/14/99 Job: [id number]

## 2010-08-14 NOTE — Consult Note (Signed)
[facility]                            ENDOCRINOLOGY CONSULTATION   [name], [name]                         MRN:          [id number]  DATE:02/04/2006                            DOB:          January 09, 1986    REFERRING PHYSICIAN:  Nance Pew   REASON FOR REFERRAL:  Hyperthyroidism.   HISTORY OF PRESENT ILLNESS:  An 25 year old woman with several weeks of  emotional lability and associated palpitations in the chest. She also has  slight heat intolerance, weight loss of 6 pounds, and a few myalgias.   PAST MEDICAL HISTORY:  Otherwise healthy. She states she is not at risk for  pregnancy and her last menstrual period was within the past few weeks.   MEDICATIONS:  Her other only medication is Inderal 10 mg twice a day which  she has been taking for a day.   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   FAMILY HISTORY:  Negative for thyroid disease.   REVIEW OF SYSTEMS:  She has minimal shortness of breath and tremor of her  hand. She denies fever.   PHYSICAL EXAMINATION:  VITAL SIGNS:  Blood pressure is 109/66, heart rate  108, temperature 98.5, weight is 92 pounds.  GENERAL:  No distress.  SKIN:  Not diaphoretic.  HEENT:  No proptosis, no periorbital swelling.  NECK:  Thyroid is three times normal size, diffuse, with a slightly  irregular surface.  CHEST:  Clear to auscultation, no respiratory distress.  CARDIOVASCULAR:  No JVD, no edema. Regular rate and rhythm, no murmur.  NEUROLOGIC:  Alert, oriented, does not appear anxious nor depressed and  there is only a minimal postural tremor present.   LABORATORY DATA:  On February 02, 2006, TSH 0.01. Electrocardiogram showed  sinus tachycardia.   IMPRESSION:  1. Hyperthyroidism of uncertain etiology.  2. Low normal blood pressure due to the propranolol.  3. Her symptom complex is probably thyroid related except for the      myalgias.   PLAN:  1.  20 minute discussion and 30 minute office visit about the risks,      natural history and treatment options of hyperthyroidism as well as a      differential diagnosis.  2. For now, decrease the Inderal to 5 mg 3 times a day.  3. PTU 150 mg twice daily.  4. Return in 2 or 3 weeks.  5. We discussed rare side effect of PTU and she must discontinue this and      call us if she has fever.  6. I have told her that given how sensitive her blood pressure seems to be      to Inderal, I would prefer to promptly normalize her thyroid function      studies with Inderal, and then consider Iodine-131 therapy in the      future.    ______________________________  Cleophas Dunker Everardo All, MD    SAE/MedQ  DD: 02/04/2006  DT: 02/05/2006  Job #: [id number]   cc:   Darryl Lent, PA

## 2011-01-05 LAB — URINALYSIS, ROUTINE W REFLEX MICROSCOPIC
Glucose, UA: NEGATIVE
Hgb urine dipstick: NEGATIVE
Protein, ur: NEGATIVE
Urobilinogen, UA: 0.2
pH: 6

## 2011-01-05 LAB — BASIC METABOLIC PANEL
CO2: 26
CO2: 28
Calcium: 8.4
Chloride: 102
Creatinine, Ser: 0.6
GFR calc non Af Amer: >60
GFR calc non Af Amer: >60
Glucose, Bld: 107 — ABNORMAL HIGH
Potassium: 3.8
Sodium: 133 — ABNORMAL LOW
Sodium: 135

## 2011-01-05 LAB — URINE CULTURE: Special Requests: NEGATIVE

## 2011-01-05 LAB — CARDIAC PANEL(CRET KIN+CKTOT+MB+TROPI): Relative Index: INVALID

## 2011-01-06 LAB — POCT I-STAT 3, ART BLOOD GAS (G3+)
Acid-Base Excess: 1
Acid-Base Excess: 3 — ABNORMAL HIGH
Acid-Base Excess: 4 — ABNORMAL HIGH
Acid-Base Excess: 5 — ABNORMAL HIGH
Acid-Base Excess: 6 — ABNORMAL HIGH
Acid-Base Excess: 9 — ABNORMAL HIGH
Bicarbonate: 26.8 — ABNORMAL HIGH
Bicarbonate: 27.5 — ABNORMAL HIGH
Bicarbonate: 27.8 — ABNORMAL HIGH
Bicarbonate: 34.1 — ABNORMAL HIGH
O2 Saturation: 96
O2 Saturation: 98
O2 Saturation: 99
Operator id: 279171
Operator id: 284871
Operator id: 285131
Operator id: 298281
Patient temperature: 38.4
Patient temperature: 39
Patient temperature: 98.3
Patient temperature: 99.6
TCO2: 28
TCO2: 31
TCO2: 32
TCO2: 32
TCO2: 34
pCO2 arterial: 41
pCO2 arterial: 45
pCO2 arterial: 46.4 — ABNORMAL HIGH
pCO2 arterial: 50.6 — ABNORMAL HIGH
pH, Arterial: 7.408 — ABNORMAL HIGH
pH, Arterial: 7.414 — ABNORMAL HIGH
pH, Arterial: 7.438 — ABNORMAL HIGH
pH, Arterial: 7.499 — ABNORMAL HIGH
pO2, Arterial: 185 — ABNORMAL HIGH
pO2, Arterial: 224 — ABNORMAL HIGH
pO2, Arterial: 297 — ABNORMAL HIGH
pO2, Arterial: 77 — ABNORMAL LOW

## 2011-01-06 LAB — CBC
HCT: 20.5 — ABNORMAL LOW
HCT: 24.8 — ABNORMAL LOW
HCT: 26.2 — ABNORMAL LOW
HCT: 26.7 — ABNORMAL LOW
HCT: 27.2 — ABNORMAL LOW
HCT: 27.9 — ABNORMAL LOW
HCT: 30.4 — ABNORMAL LOW
HCT: 30.5 — ABNORMAL LOW
HCT: 32.7 — ABNORMAL LOW
HCT: 39.6
Hemoglobin: 10.2 — ABNORMAL LOW
Hemoglobin: 10.4 — ABNORMAL LOW
Hemoglobin: 10.8 — ABNORMAL LOW
Hemoglobin: 10.9 — ABNORMAL LOW
Hemoglobin: 11.9 — ABNORMAL LOW
Hemoglobin: 12.9
Hemoglobin: 6.7 — CL
Hemoglobin: 8.6 — ABNORMAL LOW
Hemoglobin: 8.9 — ABNORMAL LOW
Hemoglobin: 8.9 — ABNORMAL LOW
MCHC: 31.8
MCHC: 32.4
MCHC: 32.5
MCHC: 32.5
MCHC: 32.5
MCHC: 32.6
MCHC: 32.7
MCHC: 33.3
MCHC: 33.6
MCV: 74.6 — ABNORMAL LOW
MCV: 74.6 — ABNORMAL LOW
MCV: 75 — ABNORMAL LOW
MCV: 75.5 — ABNORMAL LOW
MCV: 75.6 — ABNORMAL LOW
MCV: 76.7 — ABNORMAL LOW
MCV: 80.1
MCV: 80.1
MCV: 80.4
Platelets: 109 — ABNORMAL LOW
Platelets: 137 — ABNORMAL LOW
Platelets: 150
Platelets: 198
Platelets: 278
Platelets: 285
Platelets: 326
Platelets: 433 — ABNORMAL HIGH
Platelets: 467 — ABNORMAL HIGH
Platelets: 562 — ABNORMAL HIGH
Platelets: 89 — ABNORMAL LOW
Platelets: 93 — ABNORMAL LOW
RBC: 2.71 — ABNORMAL LOW
RBC: 3.42 — ABNORMAL LOW
RBC: 3.63 — ABNORMAL LOW
RBC: 4.06
RBC: 4.29
RBC: 5.71 — ABNORMAL HIGH
RDW: 13.1
RDW: 13.1
RDW: 13.2
RDW: 13.6
RDW: 13.7
RDW: 13.8
RDW: 14.4 — ABNORMAL HIGH
RDW: 18.9 — ABNORMAL HIGH
RDW: 19 — ABNORMAL HIGH
RDW: 19.4 — ABNORMAL HIGH
RDW: 19.5 — ABNORMAL HIGH
RDW: 20.1 — ABNORMAL HIGH
WBC: 14 — ABNORMAL HIGH
WBC: 19.2 — ABNORMAL HIGH
WBC: 22.7 — ABNORMAL HIGH
WBC: 7
WBC: 8
WBC: 9.6

## 2011-01-06 LAB — POCT I-STAT 7, (LYTES, BLD GAS, ICA,H+H)
Acid-Base Excess: 2
Acid-Base Excess: 4 — ABNORMAL HIGH
Bicarbonate: 24.8 — ABNORMAL HIGH
Bicarbonate: 25.2 — ABNORMAL HIGH
Bicarbonate: 26.4 — ABNORMAL HIGH
Bicarbonate: 27.4 — ABNORMAL HIGH
Calcium, Ion: 0.82 — ABNORMAL LOW
Calcium, Ion: 0.87 — ABNORMAL LOW
HCT: 33 — ABNORMAL LOW
Hemoglobin: 11.2 — ABNORMAL LOW
Hemoglobin: 7.5 — CL
Hemoglobin: 9.5 — ABNORMAL LOW
O2 Saturation: 100
O2 Saturation: 97
Operator id: 199901
Patient temperature: 98.1
Patient temperature: 98.3
Potassium: 3.6
Sodium: 137
Sodium: 138
TCO2: 26
TCO2: 27
TCO2: 29
pCO2 arterial: 32.2 — ABNORMAL LOW
pCO2 arterial: 35
pH, Arterial: 7.519 — ABNORMAL HIGH
pO2, Arterial: 328 — ABNORMAL HIGH
pO2, Arterial: 68 — ABNORMAL LOW
pO2, Arterial: 78 — ABNORMAL LOW

## 2011-01-06 LAB — CARDIAC PANEL(CRET KIN+CKTOT+MB+TROPI)
CK, MB: 12.5 — ABNORMAL HIGH
CK, MB: 5.3 — ABNORMAL HIGH
Relative Index: 2.2
Relative Index: INVALID
Total CK: 41
Troponin I: 0.03

## 2011-01-06 LAB — HEMOGLOBIN A1C
Hgb A1c MFr Bld: 5.1
Mean Plasma Glucose: 104

## 2011-01-06 LAB — COMPREHENSIVE METABOLIC PANEL
ALT: 278 — ABNORMAL HIGH
AST: 173 — ABNORMAL HIGH
AST: 19
AST: 27
AST: 28
Albumin: 2.1 — ABNORMAL LOW
Albumin: 2.3 — ABNORMAL LOW
Albumin: 2.4 — ABNORMAL LOW
Albumin: 2.8 — ABNORMAL LOW
Albumin: 2.8 — ABNORMAL LOW
Alkaline Phosphatase: 91
Alkaline Phosphatase: 99
BUN: 12
BUN: 13
BUN: 9
CO2: 26
CO2: 29
CO2: 31
CO2: 33 — ABNORMAL HIGH
Calcium: 5.9 — CL
Calcium: 6.7 — ABNORMAL LOW
Calcium: 8.2 — ABNORMAL LOW
Calcium: 8.8
Chloride: 106
Chloride: 107
Chloride: 99
Creatinine, Ser: 0.31 — ABNORMAL LOW
Creatinine, Ser: 0.36 — ABNORMAL LOW
Creatinine, Ser: 0.46
Creatinine, Ser: 0.46
Creatinine, Ser: 0.47
Creatinine, Ser: 0.49
Creatinine, Ser: 0.5
GFR calc Af Amer: >60
GFR calc Af Amer: >60
GFR calc Af Amer: >60
GFR calc Af Amer: >60
GFR calc Af Amer: >60
GFR calc non Af Amer: >60
GFR calc non Af Amer: >60
GFR calc non Af Amer: >60
GFR calc non Af Amer: >60
Glucose, Bld: 108 — ABNORMAL HIGH
Glucose, Bld: 149 — ABNORMAL HIGH
Potassium: 3.4 — ABNORMAL LOW
Sodium: 142
Total Bilirubin: 0.7
Total Bilirubin: 0.7
Total Bilirubin: 1.4 — ABNORMAL HIGH
Total Protein: 5.1 — ABNORMAL LOW
Total Protein: 5.2 — ABNORMAL LOW
Total Protein: 5.8 — ABNORMAL LOW

## 2011-01-06 LAB — BASIC METABOLIC PANEL WITH GFR
BUN: 18
CO2: 27
Calcium: 8.7
Chloride: 107
Creatinine, Ser: 0.75
GFR calc non Af Amer: >60
Glucose, Bld: 243 — ABNORMAL HIGH
Potassium: 3.6
Sodium: 143

## 2011-01-06 LAB — PHOSPHORUS
Phosphorus: 2.6
Phosphorus: 2.9
Phosphorus: 3.1
Phosphorus: 3.1
Phosphorus: 3.1
Phosphorus: 3.7
Phosphorus: 4.2

## 2011-01-06 LAB — CROSSMATCH: ABO/RH(D): A POS

## 2011-01-06 LAB — BASIC METABOLIC PANEL
BUN: 14
BUN: 16
BUN: 2 — ABNORMAL LOW
BUN: 20
BUN: 8
CO2: 22
CO2: 26
CO2: 27
CO2: 28
CO2: 28
CO2: 31
Calcium: 6.4 — CL
Calcium: 6.6 — ABNORMAL LOW
Calcium: 6.9 — ABNORMAL LOW
Calcium: 7 — ABNORMAL LOW
Calcium: 7.1 — ABNORMAL LOW
Calcium: 7.2 — ABNORMAL LOW
Calcium: 8.8
Calcium: 8.9
Chloride: 103
Chloride: 107
Chloride: 116 — ABNORMAL HIGH
Chloride: 117 — ABNORMAL HIGH
Creatinine, Ser: 0.39 — ABNORMAL LOW
Creatinine, Ser: 0.41
Creatinine, Ser: 0.62
Creatinine, Ser: 0.68
GFR calc Af Amer: >60
GFR calc Af Amer: >60
GFR calc Af Amer: >60
GFR calc non Af Amer: >60
GFR calc non Af Amer: >60
GFR calc non Af Amer: >60
GFR calc non Af Amer: >60
GFR calc non Af Amer: >60
GFR calc non Af Amer: >60
GFR calc non Af Amer: >60
GFR calc non Af Amer: >60
Glucose, Bld: 100 — ABNORMAL HIGH
Glucose, Bld: 103 — ABNORMAL HIGH
Glucose, Bld: 104 — ABNORMAL HIGH
Glucose, Bld: 105 — ABNORMAL HIGH
Glucose, Bld: 108 — ABNORMAL HIGH
Glucose, Bld: 131 — ABNORMAL HIGH
Glucose, Bld: 134 — ABNORMAL HIGH
Glucose, Bld: 150 — ABNORMAL HIGH
Glucose, Bld: 84
Potassium: 2.6 — CL
Potassium: 3 — ABNORMAL LOW
Potassium: 3 — ABNORMAL LOW
Potassium: 3.2 — ABNORMAL LOW
Potassium: 3.5
Potassium: 3.5
Potassium: 3.8
Potassium: 3.9
Potassium: 3.9
Sodium: 130 — ABNORMAL LOW
Sodium: 133 — ABNORMAL LOW
Sodium: 135
Sodium: 135
Sodium: 136
Sodium: 138
Sodium: 139
Sodium: 141
Sodium: 141
Sodium: 150 — ABNORMAL HIGH
Sodium: 150 — ABNORMAL HIGH

## 2011-01-06 LAB — CALCIUM
Calcium: 5.7 — CL
Calcium: 6.1 — CL
Calcium: 7.6 — ABNORMAL LOW

## 2011-01-06 LAB — URINE CULTURE: Special Requests: NEGATIVE

## 2011-01-06 LAB — RENAL FUNCTION PANEL
Albumin: 1.7 — ABNORMAL LOW
BUN: 18
CO2: 27
Calcium: 8.8
Calcium: 9
Chloride: 107
Creatinine, Ser: 0.76
GFR calc Af Amer: >60
GFR calc non Af Amer: >60
GFR calc non Af Amer: >60
Glucose, Bld: 245 — ABNORMAL HIGH
Phosphorus: 4.2
Phosphorus: 5.9 — ABNORMAL HIGH
Potassium: 3.6
Sodium: 144
Sodium: 150 — ABNORMAL HIGH

## 2011-01-06 LAB — AFB CULTURE WITH SMEAR (NOT AT ARMC)

## 2011-01-06 LAB — CLOSTRIDIUM DIFFICILE EIA
C difficile Toxins A+B, EIA: NEGATIVE
C difficile Toxins A+B, EIA: NEGATIVE
C difficile Toxins A+B, EIA: NEGATIVE

## 2011-01-06 LAB — FUNGUS CULTURE W SMEAR: Fungal Smear: NONE SEEN

## 2011-01-06 LAB — MAGNESIUM
Magnesium: 1.4 — ABNORMAL LOW
Magnesium: 1.4 — ABNORMAL LOW
Magnesium: 1.4 — ABNORMAL LOW
Magnesium: 1.4 — ABNORMAL LOW
Magnesium: 1.6

## 2011-01-06 LAB — CULTURE, RESPIRATORY W GRAM STAIN

## 2011-01-06 LAB — PROTIME-INR
INR: 0.9
INR: 1.1
Prothrombin Time: 12
Prothrombin Time: 14.5

## 2011-01-06 LAB — BLOOD GAS, ARTERIAL
Acid-base deficit: 0.6
Drawn by: 13898
TCO2: 26.3
pCO2 arterial: 49.8 — ABNORMAL HIGH
pH, Arterial: 7.318 — ABNORMAL LOW
pO2, Arterial: 58.9 — ABNORMAL LOW

## 2011-01-06 LAB — CULTURE, BLOOD (ROUTINE X 2): Culture: NO GROWTH

## 2011-01-06 LAB — URINE DRUGS OF ABUSE SCREEN W ALC, ROUTINE (REF LAB)
Benzodiazepines.: NEGATIVE
Cocaine Metabolites: NEGATIVE
Opiate Screen, Urine: NEGATIVE
Propoxyphene: POSITIVE — AB

## 2011-01-06 LAB — PROPOXYPHENE, CONFIRMATION: Propoxyphene or Metab. GC/MS: NEGATIVE

## 2011-01-06 LAB — POCT I-STAT EG7
Bicarbonate: 25.1 — ABNORMAL HIGH
Hemoglobin: 14.3
Operator id: 284241
Potassium: 3.4 — ABNORMAL LOW
Sodium: 135

## 2011-01-06 LAB — DIFFERENTIAL
Basophils Absolute: 0.1
Basophils Relative: 1
Eosinophils Relative: 0
Eosinophils Relative: 0
Lymphocytes Relative: 14
Lymphs Abs: 1
Monocytes Absolute: 0 — ABNORMAL LOW
Monocytes Absolute: 1 — ABNORMAL HIGH
Monocytes Relative: 1 — ABNORMAL LOW
Monocytes Relative: 6

## 2011-01-06 LAB — T4, FREE: Free T4: 0.64 — ABNORMAL LOW

## 2011-01-06 LAB — URINALYSIS, ROUTINE W REFLEX MICROSCOPIC
Bilirubin Urine: NEGATIVE
Glucose, UA: NEGATIVE
Protein, ur: NEGATIVE
Urobilinogen, UA: 0.2

## 2011-01-06 LAB — HEPATIC FUNCTION PANEL
Albumin: 3.5
Indirect Bilirubin: 0.5
Total Protein: 7.5

## 2011-01-06 LAB — APTT
aPTT: 30
aPTT: 51 — ABNORMAL HIGH

## 2011-01-06 LAB — PTH, INTACT AND CALCIUM: Calcium, Total (PTH): 6.7 — ABNORMAL LOW

## 2011-01-06 LAB — CALCIUM, IONIZED
Calcium, Ion: 0.81 — ABNORMAL LOW
Calcium, Ion: 1.16

## 2011-01-06 LAB — TSH: TSH: 0.009 — ABNORMAL LOW

## 2011-01-06 LAB — URINE MICROSCOPIC-ADD ON

## 2011-01-06 LAB — URIC ACID: Uric Acid, Serum: 9.1 — ABNORMAL HIGH

## 2011-01-06 LAB — LIPASE, BLOOD: Lipase: 26

## 2011-01-07 LAB — COMPREHENSIVE METABOLIC PANEL
ALT: 106 — ABNORMAL HIGH
ALT: 44 — ABNORMAL HIGH
Alkaline Phosphatase: 138 — ABNORMAL HIGH
BUN: 11
BUN: 9
CO2: 14 — ABNORMAL LOW
CO2: 26
Calcium: 10.7 — ABNORMAL HIGH
Calcium: 9.1
Creatinine, Ser: 0.75
GFR calc non Af Amer: >60
GFR calc non Af Amer: >60
Glucose, Bld: 112 — ABNORMAL HIGH
Glucose, Bld: <20 — CL
Potassium: 3.4 — ABNORMAL LOW
Total Protein: 6.2

## 2011-01-07 LAB — POCT I-STAT EG7
Bicarbonate: 17.3 — ABNORMAL LOW
Hemoglobin: 12.6
O2 Saturation: 43
Potassium: 6.5
TCO2: 20
pCO2, Ven: 75.4
pH, Ven: 6.967 — CL
pO2, Ven: 38

## 2011-01-07 LAB — CBC
HCT: 31.1 — ABNORMAL LOW
Hemoglobin: 10.9 — ABNORMAL LOW
Hemoglobin: 9.9 — ABNORMAL LOW
MCHC: 32.9
MCV: 76.6 — ABNORMAL LOW
RDW: 13.4
RDW: 13.7
WBC: 14.2 — ABNORMAL HIGH

## 2011-01-07 LAB — POCT I-STAT 7, (LYTES, BLD GAS, ICA,H+H)
Calcium, Ion: 1.38 — ABNORMAL HIGH
HCT: 31 — ABNORMAL LOW
O2 Saturation: 100
Operator id: 145191
Patient temperature: 98.3
Potassium: 4.8
pCO2 arterial: 43.2
pO2, Arterial: 485 — ABNORMAL HIGH

## 2011-01-07 LAB — URINALYSIS, ROUTINE W REFLEX MICROSCOPIC
Hgb urine dipstick: NEGATIVE
Nitrite: NEGATIVE
Protein, ur: NEGATIVE
Specific Gravity, Urine: 1.017
Urobilinogen, UA: 1

## 2011-01-07 LAB — BASIC METABOLIC PANEL
Chloride: 107
GFR calc non Af Amer: >60
Potassium: 6 — ABNORMAL HIGH
Sodium: 140

## 2011-01-07 LAB — POCT I-STAT 3, ART BLOOD GAS (G3+)
Acid-base deficit: 16 — ABNORMAL HIGH
Bicarbonate: 12.6 — ABNORMAL LOW
O2 Saturation: 100
Operator id: 264691
Patient temperature: 35.2
pH, Arterial: 7.361
pO2, Arterial: 267 — ABNORMAL HIGH

## 2011-01-07 LAB — CULTURE, BLOOD (ROUTINE X 2): Culture: NO GROWTH

## 2011-01-07 LAB — DIFFERENTIAL
Eosinophils Relative: 0
Lymphs Abs: 7 — ABNORMAL HIGH
Monocytes Absolute: 0.7
Monocytes Relative: 5
Neutro Abs: 6.5

## 2011-01-07 LAB — D-DIMER, QUANTITATIVE
D-Dimer, Quant: 0.34
D-Dimer, Quant: 2.88 — ABNORMAL HIGH

## 2011-01-07 LAB — I-STAT 8, (EC8 V) (CONVERTED LAB)
Acid-Base Excess: 3 — ABNORMAL HIGH
Bicarbonate: 27 — ABNORMAL HIGH
Hemoglobin: 12.9
Potassium: 3.2 — ABNORMAL LOW
Sodium: 141
TCO2: 28

## 2011-01-07 LAB — POCT I-STAT CREATININE
Creatinine, Ser: 0.6
Operator id: 270651

## 2011-01-07 LAB — APTT: aPTT: 39 — ABNORMAL HIGH

## 2011-01-07 LAB — TSH: TSH: 0.008 — ABNORMAL LOW

## 2011-01-07 LAB — PHOSPHORUS: Phosphorus: 10.1 — ABNORMAL HIGH

## 2011-01-07 LAB — CARDIAC PANEL(CRET KIN+CKTOT+MB+TROPI)
Total CK: 192 — ABNORMAL HIGH
Troponin I: 0.29 — ABNORMAL HIGH

## 2011-01-07 LAB — URINE CULTURE

## 2011-01-07 LAB — IRON AND TIBC
Iron: 122
Saturation Ratios: 54

## 2011-01-07 LAB — VITAMIN B12: Vitamin B-12: 1038 — ABNORMAL HIGH (ref 211–911)

## 2011-01-07 LAB — PROTIME-INR: INR: 1.4

## 2011-01-07 LAB — RETICULOCYTES: Retic Ct Pct: 1.1

## 2011-01-07 LAB — PREGNANCY, URINE: Preg Test, Ur: NEGATIVE

## 2011-01-07 LAB — T3, FREE: T3, Free: 18.9 — ABNORMAL HIGH (ref 2.3–4.2)

## 2011-01-07 LAB — LACTIC ACID, PLASMA: Lactic Acid, Venous: 14 — ABNORMAL HIGH

## 2011-06-18 ENCOUNTER — Encounter (HOSPITAL_COMMUNITY): Payer: Self-pay | Admitting: Emergency Medicine

## 2011-06-18 ENCOUNTER — Emergency Department (INDEPENDENT_AMBULATORY_CARE_PROVIDER_SITE_OTHER)
Admission: EM | Admit: 2011-06-18 | Discharge: 2011-06-18 | Disposition: A | Discharge status: Nursing Home (Includes ICF) | Payer: BC Managed Care – PPO | Source: Home / Self Care | Attending: Family Medicine | Admitting: Family Medicine

## 2011-06-18 ENCOUNTER — Emergency Department (HOSPITAL_COMMUNITY): Payer: BC Managed Care – PPO

## 2011-06-18 ENCOUNTER — Emergency Department (HOSPITAL_COMMUNITY)
Admission: EM | Admit: 2011-06-18 | Discharge: 2011-06-19 | Disposition: A | Discharge status: Home / Self Care | Payer: BC Managed Care – PPO | Attending: Emergency Medicine | Admitting: Emergency Medicine

## 2011-06-18 DIAGNOSIS — D649 Anemia, unspecified: Secondary | ICD-10-CM | POA: Insufficient documentation

## 2011-06-18 DIAGNOSIS — R220 Localized swelling, mass and lump, head: Secondary | ICD-10-CM | POA: Insufficient documentation

## 2011-06-18 DIAGNOSIS — Z79899 Other long term (current) drug therapy: Secondary | ICD-10-CM | POA: Insufficient documentation

## 2011-06-18 DIAGNOSIS — R630 Anorexia: Secondary | ICD-10-CM | POA: Insufficient documentation

## 2011-06-18 DIAGNOSIS — E039 Hypothyroidism, unspecified: Secondary | ICD-10-CM | POA: Insufficient documentation

## 2011-06-18 DIAGNOSIS — R259 Unspecified abnormal involuntary movements: Secondary | ICD-10-CM | POA: Insufficient documentation

## 2011-06-18 DIAGNOSIS — R252 Cramp and spasm: Secondary | ICD-10-CM

## 2011-06-18 HISTORY — DX: Hypothyroidism, unspecified: E03.9

## 2011-06-18 LAB — BASIC METABOLIC PANEL
BUN: 18 mg/dL (ref 6–23)
Chloride: 101 mEq/L (ref 96–112)
Creatinine, Ser: 0.62 mg/dL (ref 0.50–1.10)
GFR calc Af Amer: >90 mL/min (ref >90)
GFR calc non Af Amer: >90 mL/min (ref >90)
Glucose, Bld: 88 mg/dL (ref 70–99)

## 2011-06-18 LAB — DIFFERENTIAL
Basophils Absolute: 0 10*3/uL (ref 0.0–0.1)
Eosinophils Absolute: 0.1 10*3/uL (ref 0.0–0.7)
Lymphs Abs: 1.9 10*3/uL (ref 0.7–4.0)
Neutro Abs: 4.2 10*3/uL (ref 1.7–7.7)

## 2011-06-18 LAB — CBC
HCT: 27.7 % — ABNORMAL LOW (ref 36.0–46.0)
MCHC: 30.7 g/dL (ref 30.0–36.0)
MCV: 66.6 fL — ABNORMAL LOW (ref 78.0–100.0)
RDW: 17.9 % — ABNORMAL HIGH (ref 11.5–15.5)

## 2011-06-18 MED ORDER — IOHEXOL 300 MG/ML  SOLN
75.0000 mL | Freq: Once | INTRAMUSCULAR | Status: AC | PRN
  Administered 2011-06-18: 75 mL via INTRAVENOUS

## 2011-06-18 MED ORDER — OXYCODONE-ACETAMINOPHEN 5-325 MG PO TABS
1.0000 | ORAL_TABLET | Freq: Once | ORAL | Status: AC
  Administered 2011-06-19: 1 via ORAL
  Filled 2011-06-18: qty 1

## 2011-06-18 MED ORDER — PENICILLIN G BENZATHINE 1200000 UNIT/2ML IM SUSP
1.2000 10*6.[IU] | Freq: Once | INTRAMUSCULAR | Status: AC
  Administered 2011-06-19: 1.2 10*6.[IU] via INTRAMUSCULAR
  Filled 2011-06-18: qty 2

## 2011-06-18 NOTE — Discharge Instructions (Signed)
Transferred to Emergency Department for further evaluation of facial swelling and pain

## 2011-06-18 NOTE — ED Notes (Signed)
PT. TRANSFERRED FROM [facility] THIS EVENING FOR FURTHER EVALUATION OF RIGHT FACIAL SWELLING FOR 3 DAYS , UNABLE TO OPEN MOUTH DUE TO PAIN AND SWELLING , RESPIRATIONS UNLABORED , DENIES FEVER OR CHILLS.

## 2011-06-18 NOTE — ED Notes (Signed)
Pt reports having gradual increase in facial swelling since Wednesday. Pt has moderate swelling to right side of face, pt able to maintain airway but reports soreness upon swallowing. Pt also reports "discomfort when i turn my head" to right. Pt has tenderness upon palpation to right lower jaw and neck, pt reports inability to open mouth completely due to swelling and pain. Pt INAD, MAE x 4, skin w/d and resp e/u.

## 2011-06-18 NOTE — ED Notes (Signed)
Pt presents with right sided jaw pain and swelling. States that it started 2 days ago and has gotten progressively worse. States that she thinks it is her wisdom tooth coming in. Visible swelling is noted to the right jaw area, patient unable to open mouth wide enough to visually examine.

## 2011-06-18 NOTE — ED Provider Notes (Signed)
History     CSN: [id number]  Arrival date & time 06/18/11  1900   First MD Initiated Contact with Patient 06/18/11 1912      Chief Complaint  Patient presents with  . Jaw Pain    (Consider location/radiation/quality/duration/timing/severity/associated sxs/prior treatment) HPI Comments: [name] presents for evaluation of right-sided facial swelling, pain, jaw pain, and decreasing ability to open her mouth. She notes onset of symptoms 2 days ago. That progressively worsened over the last 2 days. She reports, that she's having difficulty eating at this time, but still is managing to drink. She first thought that it was with her wisdom teeth. She now reports pain and tenderness in the submental area as well as the cheek. She is unable to open her mouth for examination here the urgent care Center.  Patient is a 26 y.o. female presenting with facial injury. The history is provided by the patient.  Facial Injury  The incident occurred more than 2 days ago. The incident occurred at home. The injury mechanism is unknown.    Past Medical History  Diagnosis Date  . Hypothyroid     Past Surgical History  Procedure Date  . Thyroidectomy   . Thymectomy   . Cardiac tumor excision     Family History  Problem Relation Age of Onset  . Hypertension Mother   . Hyperlipidemia Mother     History  Substance Use Topics  . Smoking status: Never Smoker   . Smokeless tobacco: Not on file  . Alcohol Use: Yes     social    OB History    Grav Para Term Preterm Abortions TAB SAB Ect Mult Living                  Review of Systems  Constitutional: Negative.   HENT: Positive for facial swelling.   Eyes: Negative.   Respiratory: Negative.   Cardiovascular: Negative.   Gastrointestinal: Negative.   Genitourinary: Negative.   Musculoskeletal: Negative.   Skin: Negative.   Neurological: Positive for facial asymmetry.    Allergies  Review of patient's allergies indicates no known  allergies.  Home Medications   Current Outpatient Rx  Name Route Sig Dispense Refill  . CALCITRIOL 0.25 MCG PO CAPS Oral Take 0.25 mcg by mouth daily.    Marland Kitchen LEVOTHYROXINE SODIUM 75 MCG PO TABS Oral Take 75 mcg by mouth daily.    Marland Kitchen POTASSIUM CHLORIDE CRYS ER 20 MEQ PO TBCR Oral Take 20 mEq by mouth 2 (two) times daily.      BP 118/82  Pulse 82  Temp(Src) 99.7 F (37.6 C) (Oral)  SpO2 100%  Physical Exam  Nursing note and vitals reviewed. Constitutional: She is oriented to person, place, and time. She appears well-developed and well-nourished.  HENT:  Head: Normocephalic and atraumatic.       RIGHT sided jaw and cheek swelling; unable to open mouth at all for examination; TMJ not tender; tender to palpation in submental area, and along RIGHT mandible; no tenderness in neck  Eyes: EOM are normal.  Neck: Normal range of motion and full passive range of motion without pain.    Pulmonary/Chest: Effort normal.  Musculoskeletal: Normal range of motion.  Neurological: She is alert and oriented to person, place, and time.  Skin: Skin is warm and dry.  Psychiatric: Her behavior is normal.    ED Course  Procedures (including critical care time)  Labs Reviewed - No data to display No results found.   1. Facial  swelling       MDM  Transferred to Emergency Department        Renaee Munda, MD 06/18/11 2013

## 2011-06-18 NOTE — ED Provider Notes (Signed)
History     CSN: [id number]  Arrival date & time 06/18/11  2014   First MD Initiated Contact with Patient 06/18/11 2135      Chief Complaint  Patient presents with  . Facial Swelling    (Consider location/radiation/quality/duration/timing/severity/associated sxs/prior treatment) HPI Comments: Patient presents emergency department with chief complaint of trismus.  Patient was sent from urgent care.  Patient states that her symptoms began 4 days ago and have progressively worsened over the past 2 days.  Patient states originally she felt a sharp ache in her right lower tooth but then this went away.  Patient is having difficulty eating but is able to tolerate liquids.  Patient denies any difficulty breathing or airway compromise.  Patient denies any fevers, night sweats, or chills.   review of systems otherwise negative.  The history is provided by the patient.    Past Medical History  Diagnosis Date  . Hypothyroid     Past Surgical History  Procedure Date  . Thyroidectomy   . Thymectomy   . Cardiac tumor excision     Family History  Problem Relation Age of Onset  . Hypertension Mother   . Hyperlipidemia Mother     History  Substance Use Topics  . Smoking status: Never Smoker   . Smokeless tobacco: Not on file  . Alcohol Use: Yes     social    OB History    Grav Para Term Preterm Abortions TAB SAB Ect Mult Living                  Review of Systems  All other systems reviewed and are negative.    Allergies  Review of patient's allergies indicates no known allergies.  Home Medications   Current Outpatient Rx  Name Route Sig Dispense Refill  . CALCITRIOL 0.25 MCG PO CAPS Oral Take 0.25 mcg by mouth daily.    Marland Kitchen LEVOTHYROXINE SODIUM 75 MCG PO TABS Oral Take 75 mcg by mouth daily.    Marland Kitchen POTASSIUM CHLORIDE CRYS ER 20 MEQ PO TBCR Oral Take 20 mEq by mouth 2 (two) times daily.      BP 123/91  Temp(Src) 98.3 F (36.8 C) (Oral)  Resp 18  SpO2 100%  LMP  06/15/2011  Physical Exam  Nursing note and vitals reviewed. Constitutional: She is oriented to person, place, and time. She appears well-developed and well-nourished. No distress.  HENT:  Head: Normocephalic and atraumatic. No trismus in the jaw.  Mouth/Throat: Uvula is midline, oropharynx is clear and moist and mucous membranes are normal. Abnormal dentition. No dental abscesses or uvula swelling. No oropharyngeal exudate, posterior oropharyngeal edema, posterior oropharyngeal erythema or tonsillar abscesses.       Patient unable to open and close mouth with out difficulty.  Trismus interfered with physical exam . Airway intact. Uvula midline. No swelling or tenderness of submental and submandibular regions.  Mild right-sided cheek facial swelling.  Eyes: Conjunctivae and EOM are normal.  Neck: Normal range of motion and full passive range of motion without pain. Neck supple.       No difficulty breathing with flexion or extension of neck.  Supple nonrigid.  Swallow intact.  No cervical adenopathy.  Cardiovascular: Normal rate and regular rhythm.   Pulmonary/Chest: Effort normal and breath sounds normal. No stridor. No respiratory distress. She has no wheezes.  Musculoskeletal: Normal range of motion.  Lymphadenopathy:       Head (right side): No submental, no submandibular, no tonsillar, no preauricular and  no posterior auricular adenopathy present.       Head (left side): No submental, no submandibular, no tonsillar, no preauricular and no posterior auricular adenopathy present.    She has no cervical adenopathy.  Neurological: She is alert and oriented to person, place, and time.  Skin: Skin is warm and dry. No rash noted. She is not diaphoretic.    ED Course  Procedures (including critical care time)  Labs Reviewed  CBC - Abnormal; Notable for the following:    Hemoglobin 8.5 (*)    HCT 27.7 (*)    MCV 66.6 (*)    MCH 20.4 (*)    RDW 17.9 (*)    All other components within  normal limits  BASIC METABOLIC PANEL - Abnormal; Notable for the following:    Calcium 8.3 (*)    All other components within normal limits  DIFFERENTIAL   Ct Maxillofacial W/cm  06/18/2011  *RADIOLOGY REPORT*  Clinical Data: Right-sided facial swelling and pain.  CT MAXILLOFACIAL WITH CONTRAST  Technique:  Multidetector CT imaging of the maxillofacial structures was performed with intravenous contrast. Multiplanar CT image reconstructions were also generated.  Contrast:  75 ml Omnipaque-300  Comparison: None.  Findings: Mild asymmetric stranding is seen within the subcutaneous tissues in the right maxilla.  There is no evidence of soft tissue gas or abscess.  No soft tissue mass or lymphadenopathy identified.  The globes and intraorbital anatomy are normal appearance.  No evidence of orbital emphysema or sinus air fluid levels.  The paranasal sinuses are clear.  IMPRESSION: Minimal soft tissue stranding in the subcutaneous tissues in the right maxillary region.  No evidence of abscess or other significant abnormality.  Original Report Authenticated By: Danae Orleans, M.D.     No diagnosis found.    MDM  Trismus, anemia  Hgb 8.5, Pt currently menstruating and asymptomatic (denies lightheaded, dizzy, syncope), pt aware of anemia  Etiology of patient's trismus is likely dental in nature.  Patient's pain has been managed.  Physical exam was limited due to chief complaint.  Unable to locate evidence of possible abscess or cold exposure.  Patient has been treated in the emergency department with an IM penicillin shot she'll be discharged with penicillin painkillers with instructions to followup with dentist tomorrow.  Patient presentation is not concerning for retro-pharyngeal abscess and has no neck swelling or rigidity.  Patient case was discussed with Dr. Manus Gunning who agrees with my treatment plan.        Jaci Carrel, [place] 06/19/11 0110

## 2011-06-19 MED ORDER — PENICILLIN V POTASSIUM 500 MG PO TABS: 500.0000 mg | ORAL_TABLET | Freq: Four times a day (QID) | ORAL | Status: AC

## 2011-06-19 MED ORDER — OXYCODONE-ACETAMINOPHEN 5-325 MG PO TABS: 1.0000 | ORAL_TABLET | Freq: Four times a day (QID) | ORAL | Status: AC | PRN

## 2011-06-19 NOTE — Discharge Instructions (Signed)
You have a dental injury. Use the resource guide listed below to help you find a dentist if you do not already have one to followup with. It is very important that you get evaluated by a dentist as soon as possible. Call tomorrow to schedule an appointment. Use your pain medication as prescribed and do not operate heavy machinery while on pain medication. Note that your pain medication contains acetaminophen (Tylenol) & its is not reccommended that you use additional acetaminophen (Tylenol) while taking this medication. Take your full course of antibiotics. Read the instructions below.  Eat a soft or liquid diet and rinse your mouth out after meals with warm water. You should see a dentist or return here at once if you have increased swelling, increased pain or uncontrolled bleeding from the site of your injury.   SEEK MEDICAL CARE IF:   You have increased pain not controlled with medicines.   You have swelling around your tooth, in your face or neck.   You have bleeding which starts, continues, or gets worse.   You have a fever >101  If you are unable to open your mouth  RESOURCE GUIDE  Dental Problems  Patients with Medicaid: [facility]                     [facility] [address]                                           [address] Phone:  [phone]                                                  Phone:  [phone]  If unable to pay or uninsured, contact:  [facility] or [facility] to become qualified for the adult dental clinic.  Chronic Pain Problems Contact [facility]  [phone] Patients need to be referred by their primary care doctor.  Insufficient Money for Medicine Contact [employer]:  call "211" or [facility] 271-5999.  No Primary Care Doctor Call [facility]  832-8000 Other agencies that provide inexpensive medical care    [facility]  832-8035    [facility]  832-7272    [facility]  [phone]    Women's Clinic  832-4777    [facility]  [phone]    [facility]  272-1050  Psychological Services [facility]  832-9600 [facility]  [phone] [facility]   800 853-5163 (emergency services 641-4993)  Substance Abuse Resources [facility]  336-882-2125 [facility] 336-784-9470 [facility] 336-285-9073 [facility] 336-845-3988 Residential & Outpatient Substance Abuse Program  800-659-3381  Abuse/Neglect [place] Child Abuse Hotline (336) 641-3795 [place] Child Abuse Hotline 800-378-5315 (After Hours)  Emergency Shelter [facility] (336) 271-5985  [facility] (336) 275-9566 [facility] (704) 372-4663  MRSA Hotline #:   832-7006    [place] Resources  [facility] [place]     [employer]                            [facility] [address] [place]                       [address]      [address]  [place]                                                [place]                            [place] Phone:  [phone]                                   Phone:  [phone]                 Phone:  [phone]  [facility] Phone:  342-8316  [place] Child Abuse Hotline (336) 342-1394 (336) 342-3537 (After Hours)    

## 2011-06-19 NOTE — ED Notes (Signed)
Pt denies any questions upon discharge. 

## 2011-06-19 NOTE — ED Provider Notes (Signed)
Medical screening examination/treatment/procedure(s) were performed by non-physician practitioner and as supervising physician I was immediately available for consultation/collaboration.  R sided facial swelling x 3 days. +trismus.  Floor of mouth soft.  Controlling secretions.  No drooling, difficulty swallowing, SOB. No obcious abscess  Glynn Octave, MD 06/19/11 [phone]

## 2014-07-09 ENCOUNTER — Ambulatory Visit (INDEPENDENT_AMBULATORY_CARE_PROVIDER_SITE_OTHER): Payer: Self-pay | Admitting: Internal Medicine

## 2014-07-09 VITALS — BP 112/74 | HR 72 | Temp 98.2°F | Resp 16 | Ht 60.0 in | Wt 103.5 lb

## 2014-07-09 DIAGNOSIS — Z0289 Encounter for other administrative examinations: Secondary | ICD-10-CM

## 2014-07-09 DIAGNOSIS — E890 Postprocedural hypothyroidism: Secondary | ICD-10-CM

## 2014-07-09 DIAGNOSIS — Z23 Encounter for immunization: Secondary | ICD-10-CM

## 2014-07-09 DIAGNOSIS — Z111 Encounter for screening for respiratory tuberculosis: Secondary | ICD-10-CM

## 2014-07-09 DIAGNOSIS — Z0000 Encounter for general adult medical examination without abnormal findings: Secondary | ICD-10-CM

## 2014-07-09 NOTE — Progress Notes (Signed)
  Tuberculosis Risk Questionnaire  1. Yes  Were you born outside the [place] in one of the following parts of the world: [place], [place], [place], [place] America or Eastern [place]?    2. Yes  Have you traveled outside the [place] and lived for more than one month in one of the following parts of the world: [place], [place], [place], [place] or [place]?    3. No Do you have a compromised immune system such as from any of the following conditions:HIV/AIDS, organ or bone marrow transplantation, diabetes, immunosuppressive medicines (e.g. Prednisone, Remicaide), leukemia, lymphoma, cancer of the head or neck, gastrectomy or jejunal bypass, end-stage renal disease (on dialysis), or silicosis?     4. No Have you ever or do you plan on working in: a residential care center, a health care facility, a jail or prison or homeless shelter?    5. No Have you ever: injected illegal drugs, used crack cocaine, lived in a homeless shelter  or been in jail or prison?     6. No Have you ever been exposed to anyone with infectious tuberculosis?    Tuberculosis Symptom Questionnaire  Do you currently have any of the following symptoms?  1. No Unexplained cough lasting more than 3 weeks?   2. No Unexplained fever lasting more than 3 weeks.   3. No Night Sweats (sweating that leaves the bedclothes and sheets wet)     4. No Shortness of Breath   5. No  Chest Pain   6. No Unintentional weight loss    7. No Unexplained fatigue (very tired for no reason)   

## 2014-07-09 NOTE — Progress Notes (Signed)
Subjective:  This chart was scribed for Diane Siaobert Jory Welke, MD by [name], medical scribe at [facility].The patient was seen in exam room 04 and the patient's care was started at 9:21 PM.   Patient .ID: [name], female    DOB: 12/02/1985, 29 y.o.   MRN: [id number] Chief Complaint  Patient presents with  . Annual Exam    Admin Physical   HPI  HPI Comments: [name] is a 29 y.o. 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 She also needs physical assessment.  She is not taking any medications currently and has not for the past 5 years although she is status post thyroidectomy and should be hypothyroid. She has no symptoms and prefers to take no medicines. She also prefers not to be retested at this point. Pt exercises and eats regularly.  Pt attended [employer]. 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  Patient Active Problem List   Diagnosis Date Noted  . 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 POSTSURGICAL HYPOTHYROIDISM 02/20/2007  . HYPOCALCEMIA 02/20/2007  . HYPOKALEMIA 02/20/2007  . ELECTROLYTE IMBALANCE 02/03/2007  . ANEMIA, MILD 02/03/2007  . HYPERTENSION 02/03/2007   Past Medical History  Diagnosis Date  . Hypothyroid    Past Surgical History  Procedure Laterality Date  . Thyroidectomy    . Thymectomy    . Cardiac tumor excision     No Known Allergies Prior to Admission medications   Medication Sig Start Date End Date Taking? Authorizing Provider  calcitRIOL (ROCALTROL) 0.25 MCG capsule Take 0.25 mcg by mouth daily.    Historical Provider, MD  levothyroxine (SYNTHROID, LEVOTHROID) 75 MCG tablet Take 75 mcg by mouth daily.    Historical Provider, MD  potassium chloride SA (K-DUR,KLOR-CON) 20 MEQ tablet Take 20 mEq by mouth 2 (two) times daily.    Historical Provider, MD   Review of Systems  Constitutional: Negative for fever, activity change, appetite change, fatigue and unexpected  weight change.  HENT: Negative for dental problem, ear pain, hearing loss, sinus pressure and trouble swallowing.   Eyes: Negative for visual disturbance.  Respiratory: Negative for shortness of breath and wheezing.   Cardiovascular: Negative for chest pain, palpitations and leg swelling.  Gastrointestinal: Negative for abdominal pain, diarrhea and constipation.  Genitourinary: Negative for dysuria, frequency, difficulty urinating and menstrual problem.  Musculoskeletal: Negative for joint swelling and arthralgias.  Skin: Negative for rash.  Neurological: Negative for headaches.  Hematological: Does not bruise/bleed easily.  Psychiatric/Behavioral: Negative for behavioral problems, sleep disturbance, dysphoric mood and decreased concentration.       Objective:  BP 112/74 mmHg  Pulse 72  Temp(Src) 98.2 F (36.8 C) (Oral)  Resp 16  Ht 5' (1.524 m)  Wt 103 lb 8 oz (46.947 kg)  BMI 20.21 kg/m2  SpO2 97%  LMP 07/03/2014   Physical Exam  Constitutional: She is oriented to person, place, and time. She appears well-developed and well-nourished. No distress.  HENT:  Head: Normocephalic and atraumatic.  Right Ear: External ear normal.  Left Ear: External ear normal.  Nose: Nose normal.  Mouth/Throat: Oropharynx is clear and moist.  Eyes: Conjunctivae and EOM are normal. Pupils are equal, round, and reactive to light.  Neck: Normal range of motion. Neck supple. No thyromegaly present.  Cardiovascular: Normal rate, regular rhythm, normal heart sounds and intact distal pulses.   No murmur heard. Pulmonary/Chest: Effort normal and breath sounds normal. No respiratory distress.  Abdominal: Soft. She exhibits no mass. There is no tenderness.  No hepatosplenomegaly  Musculoskeletal: Normal range of motion. She exhibits no edema.  Lymphadenopathy:    She has no cervical adenopathy.  Neurological: She is alert and oriented to person, place, and time. She has normal reflexes. No cranial  nerve deficit.  Skin: Skin is warm and dry.  Psychiatric: She has a normal mood and affect. Her behavior is normal. Thought content normal.  Nursing note and vitals reviewed.     Assessment & Plan:  PPD screening test - Plan: TB Skin Test, Hepatitis B surface antibody, Measles/Mumps/Rubella Immunity  Immunization due - Plan: Hepatitis B surface antibody, Measles/Mumps/Rubella Immunity, Tdap vaccine greater than or equal to 7yo IM  Administrative physical--appears healthy  Hypothyroidism-postsurgical, currently untreated but asymptomatic  She prefers to wait on testing   I have completed the patient encounter in its entirety as documented by the scribe, with editing by me where necessary. [name], M.D.

## 2014-07-10 LAB — HEPATITIS B SURFACE ANTIBODY, QUANTITATIVE: Hepatitis B-Post: 1000 m[IU]/mL

## 2014-07-11 DIAGNOSIS — E890 Postprocedural hypothyroidism: Secondary | ICD-10-CM | POA: Insufficient documentation

## 2014-07-11 LAB — MEASLES/MUMPS/RUBELLA IMMUNITY
Mumps IgG: 36.6 AU/mL — ABNORMAL HIGH (ref <9.00)
Rubella: 0.5 Index (ref <0.90)

## 2014-07-12 ENCOUNTER — Ambulatory Visit (INDEPENDENT_AMBULATORY_CARE_PROVIDER_SITE_OTHER): Payer: Self-pay | Admitting: Physician Assistant

## 2014-07-12 DIAGNOSIS — Z23 Encounter for immunization: Secondary | ICD-10-CM

## 2014-07-12 LAB — TB SKIN TEST
INDURATION: 0 mm
TB SKIN TEST: NEGATIVE

## 2014-07-13 NOTE — Progress Notes (Signed)
Pt was here 2 days ago and a TDAP was ordered but she did not get because she wanted to check out prices.  She has determined that she wants to get it here today.  Her TB test was read already today by a clinical staff member.

## 2014-07-23 ENCOUNTER — Ambulatory Visit (INDEPENDENT_AMBULATORY_CARE_PROVIDER_SITE_OTHER): Payer: Self-pay | Admitting: *Deleted

## 2014-07-23 DIAGNOSIS — Z111 Encounter for screening for respiratory tuberculosis: Secondary | ICD-10-CM

## 2014-07-26 ENCOUNTER — Ambulatory Visit (INDEPENDENT_AMBULATORY_CARE_PROVIDER_SITE_OTHER): Payer: Self-pay | Admitting: Radiology

## 2014-07-26 DIAGNOSIS — Z111 Encounter for screening for respiratory tuberculosis: Secondary | ICD-10-CM

## 2014-07-26 LAB — TB SKIN TEST: TB Skin Test: NEGATIVE

## 2014-08-02 ENCOUNTER — Encounter: Payer: Self-pay | Admitting: *Deleted

## 2014-08-02 NOTE — Progress Notes (Signed)
Pt was seen in office for 2nd TB test. No OV

## 2018-12-19 ENCOUNTER — Other Ambulatory Visit: Payer: Self-pay

## 2018-12-19 DIAGNOSIS — Z20822 Contact with and (suspected) exposure to covid-19: Secondary | ICD-10-CM

## 2018-12-21 LAB — NOVEL CORONAVIRUS, NAA: SARS-CoV-2, NAA: NOT DETECTED

## 2019-02-28 ENCOUNTER — Other Ambulatory Visit: Payer: Self-pay

## 2019-02-28 DIAGNOSIS — Z20822 Contact with and (suspected) exposure to covid-19: Secondary | ICD-10-CM

## 2019-03-02 LAB — NOVEL CORONAVIRUS, NAA: SARS-CoV-2, NAA: NOT DETECTED

## 2023-09-06 NOTE — Progress Notes (Signed)
**Note De-Identified Diane Obfuscation**  Assessment:   1. [name] without complication          Plan:   1. Labs and medications ordered today:  Patient's Medications  New Prescriptions   No medications on file     Outpatient Encounter Medications as of 09/06/2023  Medication Sig Dispense Refill  . ibuprofen (ADVIL,MOTRIN) 800 mg tablet Take one tablet (800 mg dose) by mouth every 8 (eight) hours as needed. 60 tablet 0  . levothyroxine sodium (SYNTHROID) 100 mcg tablet Take one tablet (100 mcg dose) by mouth daily. 30 tablet 5  . Prenatal Vit-Fe Fumarate-FA (PRENATAL VITAMIN PO) Take 1 tablet by mouth daily.     No facility-administered encounter medications on file as of 09/06/2023.   Calamine lotion as needed for itching. Lidoderm patch on 12 hours off 12 hours as needed for pain. OTC Tylenol  or NSAID PRN.  Follow up with your PCP 7-10 days, sooner if worse or if new symptoms.  Go to the ED if worse.  Previsit planning was completed Diane snapshot and review of chart.  I reviewed the patient instructions on the AVS with the patient/family verbalized to me that they understood what their problem is, what they need to do about it, and why it is important that they do it.  The patient/family voices understanding of all medications. No barriers to adherence were noted. Patient is taking all medications as prescribed and is tolerating well.  Plan for follow-up as discussed or as needed if any worsening symptoms or change in condition.  After Visit Summary was given to patient/or sent to MyChart.   MDM: Patient is a pleasant 38 year old female who presents with a dermatomal rash on the right thorax.  Her symptoms started 2 weeks ago.  Considered antivirals, but rash is beginning to heal.  Recommend calamine lotion and Lidoderm patch as needed for shingles. Newell Niels Grew, NP   Portions of the history and exam were entered using voice recognition software.  Minor syntax, contextual, and spelling errors may be related  to the use of this software and were not intentional.  If corrections are necessary, please contact provider. Subjective:   Rash:[name] is a 38 y.o. female who complains of a rash.   Symptoms have been present for 2 weeks.  The rash is located on the trunk.   Discomfort is mild. Patient does not have fever. She has tried OTC meds which have not helped.  Recent illnesses: none. Sick contacts: none known. No CP, SOB, HA, or dizziness.    Past Medical History:  Diagnosis Date  . Anemia, mild 02/03/2007   Formatting of this note might be different from the original. Qualifier: Diagnosis of  By: Norleen MD, Lynwood ORN  . Disease of thyroid gland   . Hypertension 02/03/2007   Formatting of this note might be different from the original. Qualifier: Diagnosis of  By: Norleen MD, Lynwood ORN  . Hypocalcemia 02/20/2007   Formatting of this note might be different from the original. Qualifier: Diagnosis of  By: Kassie MD, Alyce LABOR  . Hypokalemia 02/20/2007   Formatting of this note might be different from the original. Qualifier: Diagnosis of  By: Kassie MD, Alyce LABOR  . Hypoparathyroidism (*) 08/14/2007   Formatting of this note might be different from the original. Qualifier: Diagnosis of  By: Kassie MD, Alyce LABOR  . Postprocedural hypothyroidism 02/20/2007   Formatting of this note might be different from the original. Qualifier: Diagnosis of  By: Kassie MD, Alyce LABOR  No Known Allergies     Objective:   BP (!) 140/95 (BP Location: Right Upper Arm, Patient Position: Sitting)   Pulse 66   Temp 98.1 F (36.7 C) (Oral)   Resp 18   Ht 4' 11 (1.499 m)   Wt 138 lb (62.6 kg)   SpO2 98%   BMI 27.87 kg/m   General appearance: alert, cooperative and no distress Throat: lips, mucosa, and tongue normal; teeth and gums normal Neck: no adenopathy, supple, symmetrical, no tenderness/mass/nodules Lungs: clear to auscultation bilaterally Heart: regular rate and rhythm, S1, S2 normal, no murmur, click, rub or  gallop Skin: Dermatomal red raised vesicular rash that is beginning to scale over.

## 2024-02-12 ENCOUNTER — Emergency Department (HOSPITAL_BASED_OUTPATIENT_CLINIC_OR_DEPARTMENT_OTHER)

## 2024-02-12 ENCOUNTER — Emergency Department (HOSPITAL_BASED_OUTPATIENT_CLINIC_OR_DEPARTMENT_OTHER)
Admission: EM | Admit: 2024-02-12 | Discharge: 2024-02-12 | Disposition: A | Discharge status: Home / Self Care | Attending: Emergency Medicine | Admitting: Emergency Medicine

## 2024-02-12 ENCOUNTER — Other Ambulatory Visit: Payer: Self-pay

## 2024-02-12 ENCOUNTER — Encounter (HOSPITAL_BASED_OUTPATIENT_CLINIC_OR_DEPARTMENT_OTHER): Payer: Self-pay | Admitting: Emergency Medicine

## 2024-02-12 DIAGNOSIS — S0990XA Unspecified injury of head, initial encounter: Secondary | ICD-10-CM | POA: Insufficient documentation

## 2024-02-12 DIAGNOSIS — W228XXA Striking against or struck by other objects, initial encounter: Secondary | ICD-10-CM | POA: Insufficient documentation

## 2024-02-12 DIAGNOSIS — Y9339 Activity, other involving climbing, rappelling and jumping off: Secondary | ICD-10-CM | POA: Diagnosis not present

## 2024-02-12 MED ORDER — KETOROLAC TROMETHAMINE 15 MG/ML IJ SOLN
15.0000 mg | Freq: Once | INTRAMUSCULAR | Status: AC
  Administered 2024-02-12: 15 mg via INTRAMUSCULAR
  Filled 2024-02-12: qty 1

## 2024-02-12 NOTE — ED Notes (Signed)
 Patient transferred from waiting room to ED treatment room. Assuming pt care at this time.

## 2024-02-12 NOTE — ED Triage Notes (Signed)
 Pt c/o HA since Thurs when she jumped and hit the top of her head on monkey bar while trying to get her son

## 2024-02-12 NOTE — ED Provider Notes (Signed)
 [place] [facility] Provider Note   CSN: [id number] Arrival date & time: 02/12/24  1810     Patient presents with: Head Injury   [name] is a 38 y.o. female.  Patient presents to the emergency department today with concerns of a head injury.  She reports that on Thursday, 4 days ago, she jumped when she was trying to chase after her son on a playground and hit the top of her head on the monkey bar.  She reports having a headache since then with some slight nausea but denies any vomiting, loss of consciousness, or severe headache.  She is not current on any blood thinner.    Head Injury Associated symptoms: headache        Prior to Admission medications   Medication Sig Start Date End Date Taking? Authorizing Provider  calcitRIOL (ROCALTROL) 0.25 MCG capsule Take 0.25 mcg by mouth daily.    [provider]  levothyroxine (SYNTHROID, LEVOTHROID) 75 MCG tablet Take 75 mcg by mouth daily.    [provider]  potassium chloride SA (K-DUR,KLOR-CON) 20 MEQ tablet Take 20 mEq by mouth 2 (two) times daily.    [provider]    Allergies: Patient has no known allergies.    Review of Systems  Neurological:  Positive for headaches.  All other systems reviewed and are negative.   Updated Vital Signs BP (!) 165/94 (BP Location: Right Arm)   Pulse 67   Temp 98.1 F (36.7 C) (Oral)   Resp 16   Ht 4' 10 (1.473 m)   Wt 61.2 kg   SpO2 100%   BMI 28.22 kg/m   Physical Exam Vitals and nursing note reviewed.  Constitutional:      General: She is not in acute distress.    Appearance: She is well-developed.  HENT:     Head: Normocephalic and atraumatic.  Eyes:     Conjunctiva/sclera: Conjunctivae normal.  Cardiovascular:     Rate and Rhythm: Normal rate and regular rhythm.     Heart sounds: No murmur heard. Pulmonary:     Effort: Pulmonary effort is normal. No respiratory distress.     Breath sounds: Normal  breath sounds.  Abdominal:     Palpations: Abdomen is soft.     Tenderness: There is no abdominal tenderness.  Musculoskeletal:        General: No swelling.     Cervical back: Neck supple.  Skin:    General: Skin is warm and dry.     Capillary Refill: Capillary refill takes less than 2 seconds.     Findings: No lesion.  Neurological:     Mental Status: She is alert.  Psychiatric:        Mood and Affect: Mood normal.     (all labs ordered are listed, but only abnormal results are displayed) Labs Reviewed - No data to display  EKG: None  Radiology: CT Head Wo Contrast Result Date: 02/12/2024 EXAM: CT HEAD WITHOUT CONTRAST 02/12/2024 08:40:00 PM TECHNIQUE: CT of the head was performed without the administration of intravenous contrast. Automated exposure control, iterative reconstruction, and/or weight based adjustment of the mA/kV was utilized to reduce the radiation dose to as low as reasonably achievable. COMPARISON: CT head 05/13/2016 CLINICAL HISTORY: Head trauma, GCS=15, severe headache (Ped 2-17y) FINDINGS: BRAIN AND VENTRICLES: No acute hemorrhage. No evidence of acute infarct. No hydrocephalus. No extra-axial collection. No mass effect or midline shift. ORBITS: No acute abnormality. SINUSES: No acute  abnormality. SOFT TISSUES AND SKULL: No acute soft tissue abnormality. No skull fracture. IMPRESSION: 1. No acute intracranial abnormality. Electronically signed by: Morgane Naveau MD 02/12/2024 08:47 PM EST RP Workstation: [id number]     Procedures   Medications Ordered in the ED  ketorolac (TORADOL) 15 MG/ML injection 15 mg (15 mg Intramuscular Given 02/12/24 2215)                                    Medical Decision Making Amount and/or Complexity of Data Reviewed Radiology: ordered.  Risk Prescription drug management.   This patient presents to the ED for concern of head pain.  Differential diagnosis includes traumatic brain injury, concussion, headache, cluster  headache, migraine    Additional history obtained:  Additional history obtained from chart review    Imaging Studies ordered:  I ordered imaging studies including CT head I independently visualized and interpreted imaging which showed negative for any acute findings I agree with the radiologist interpretation   Medicines ordered and prescription drug management:  I ordered medication including Toradol for pain Reevaluation of the patient after these medicines showed that the patient improved I have reviewed the patients home medicines and have made adjustments as needed   Problem List / ED Course:  Patient presents to the emergency department with concerns of head injury.  Reports that she has been experiencing a headache for the last 4 days since she struck the top of her head on the monkey bars at the playground while she was running chasing her son.  She denies any loss of consciousness at that time.  No reported nausea or vomiting but states that her headache is not well-controlled at this time with Tylenol  or ibuprofen.  Denies any fever, chills, or bodyaches. Physical exam is unremarkable.  Pupils are PERRL.  No obvious lesion or laceration to the scalp is seen. CT imaging ordered from triage which showed no acute findings.  I suspect likely minor head trauma given the impact that patient sustained to the top of her head.  A dose of Toradol was given here in the emergency department with patient verbally confirming no concern for pregnancy at this time.  Encouraged use of Tylenol  as needed for headaches and to hold off on ibuprofen or naproxen for at least 12 hours and ideally 24 hours given administration of Toradol.  Return precautions discussed such as concerns for new or worsening symptoms.  She is otherwise stable for outpatient follow-up and discharged home.   Social Determinants of Health:  None  Final diagnoses:  Minor head injury, initial encounter    ED  Discharge Orders     None          Cecily Legrand DELENA DEVONNA 02/12/24 2327    Dreama Longs, MD 02/13/24 2155

## 2024-02-12 NOTE — Discharge Instructions (Signed)
 You were seen in the emergency department today for concerns of a head injury.  Your CT scan was thankfully normal without any obvious signs of injury, bleeding, or other traumatic findings.  I would recommend taking Tylenol  and ibuprofen for your headache as needed.  You were given Toradol here before you were discharged home to help with your headache.  Avoid taking ibuprofen or naproxen for the next 12 hours as these medications are in the same class and may give you some increased side effects.  If needed over the next 12 hours, you can take Tylenol  but only Tylenol .  For any concerns of new or worsening symptoms, return to the emergency department.
